# Patient Record
Sex: Male | Born: 1980
Health system: Southern US, Community
[De-identification: ages and names within clinical notes are randomized; demographics above are authoritative.]

## PROBLEM LIST (undated history)

## (undated) HISTORY — PX: INGUINAL HERNIA REPAIR: SUR1180

---

## 2004-09-13 ENCOUNTER — Emergency Department: Payer: Self-pay | Admitting: Emergency Medicine

## 2007-04-03 ENCOUNTER — Emergency Department: Payer: Self-pay | Admitting: Emergency Medicine

## 2007-04-27 ENCOUNTER — Emergency Department: Payer: Self-pay | Admitting: Emergency Medicine

## 2007-05-22 ENCOUNTER — Emergency Department: Payer: Self-pay | Admitting: Emergency Medicine

## 2007-06-26 ENCOUNTER — Other Ambulatory Visit: Payer: Self-pay

## 2007-06-26 ENCOUNTER — Emergency Department: Payer: Self-pay | Admitting: Emergency Medicine

## 2007-08-20 ENCOUNTER — Emergency Department: Payer: Self-pay | Admitting: Internal Medicine

## 2007-09-08 ENCOUNTER — Ambulatory Visit: Payer: Self-pay | Admitting: Urology

## 2008-11-23 ENCOUNTER — Emergency Department: Payer: Self-pay | Admitting: Internal Medicine

## 2009-06-30 ENCOUNTER — Emergency Department: Payer: Self-pay | Admitting: Emergency Medicine

## 2010-02-23 ENCOUNTER — Emergency Department: Payer: Self-pay | Admitting: Emergency Medicine

## 2010-02-27 ENCOUNTER — Emergency Department: Payer: Self-pay | Admitting: Unknown Physician Specialty

## 2010-06-03 ENCOUNTER — Emergency Department: Payer: Self-pay | Admitting: Unknown Physician Specialty

## 2013-02-08 ENCOUNTER — Emergency Department: Payer: Self-pay | Admitting: Emergency Medicine

## 2013-06-23 ENCOUNTER — Emergency Department: Payer: Self-pay | Admitting: Internal Medicine

## 2013-10-28 ENCOUNTER — Emergency Department: Payer: Self-pay | Admitting: Emergency Medicine

## 2013-10-29 LAB — BETA STREP CULTURE(ARMC)

## 2014-02-15 ENCOUNTER — Emergency Department: Payer: Self-pay | Admitting: Internal Medicine

## 2014-02-15 LAB — BASIC METABOLIC PANEL
Anion Gap: 7 (ref 7–16)
BUN: 11 mg/dL (ref 7–18)
Calcium, Total: 8.2 mg/dL — ABNORMAL LOW (ref 8.5–10.1)
Chloride: 108 mmol/L — ABNORMAL HIGH (ref 98–107)
Co2: 25 mmol/L (ref 21–32)
Creatinine: 0.75 mg/dL (ref 0.60–1.30)
Glucose: 106 mg/dL — ABNORMAL HIGH (ref 65–99)
Osmolality: 279 (ref 275–301)
POTASSIUM: 3.9 mmol/L (ref 3.5–5.1)
SODIUM: 140 mmol/L (ref 136–145)

## 2014-02-15 LAB — TROPONIN I: Troponin-I: <0.02 ng/mL

## 2014-02-15 LAB — CBC
HCT: 50.8 % (ref 40.0–52.0)
HGB: 17.2 g/dL (ref 13.0–18.0)
MCH: 28.5 pg (ref 26.0–34.0)
MCHC: 33.9 g/dL (ref 32.0–36.0)
MCV: 84 fL (ref 80–100)
Platelet: 231 10*3/uL (ref 150–440)
RBC: 6.03 10*6/uL — ABNORMAL HIGH (ref 4.40–5.90)
RDW: 13.9 % (ref 11.5–14.5)
WBC: 9.6 10*3/uL (ref 3.8–10.6)

## 2014-08-26 ENCOUNTER — Ambulatory Visit: Payer: Self-pay

## 2015-06-17 ENCOUNTER — Encounter (HOSPITAL_COMMUNITY): Payer: Self-pay | Admitting: Emergency Medicine

## 2015-06-17 ENCOUNTER — Inpatient Hospital Stay (HOSPITAL_COMMUNITY)
Admission: EM | Admit: 2015-06-17 | Discharge: 2015-06-22 | DRG: 885 | Disposition: A | Discharge status: Home / Self Care | Payer: Self-pay | Attending: Family Medicine | Admitting: Family Medicine

## 2015-06-17 ENCOUNTER — Emergency Department (HOSPITAL_COMMUNITY): Payer: Self-pay

## 2015-06-17 DIAGNOSIS — F4312 Post-traumatic stress disorder, chronic: Secondary | ICD-10-CM | POA: Diagnosis present

## 2015-06-17 DIAGNOSIS — H539 Unspecified visual disturbance: Secondary | ICD-10-CM

## 2015-06-17 DIAGNOSIS — F17210 Nicotine dependence, cigarettes, uncomplicated: Secondary | ICD-10-CM | POA: Diagnosis present

## 2015-06-17 DIAGNOSIS — F39 Unspecified mood [affective] disorder: Secondary | ICD-10-CM | POA: Diagnosis present

## 2015-06-17 DIAGNOSIS — R9089 Other abnormal findings on diagnostic imaging of central nervous system: Secondary | ICD-10-CM | POA: Insufficient documentation

## 2015-06-17 DIAGNOSIS — F22 Delusional disorders: Principal | ICD-10-CM | POA: Diagnosis present

## 2015-06-17 DIAGNOSIS — R410 Disorientation, unspecified: Secondary | ICD-10-CM

## 2015-06-17 DIAGNOSIS — F513 Sleepwalking [somnambulism]: Secondary | ICD-10-CM | POA: Diagnosis present

## 2015-06-17 DIAGNOSIS — R443 Hallucinations, unspecified: Secondary | ICD-10-CM | POA: Diagnosis present

## 2015-06-17 DIAGNOSIS — R441 Visual hallucinations: Secondary | ICD-10-CM | POA: Diagnosis present

## 2015-06-17 DIAGNOSIS — R479 Unspecified speech disturbances: Secondary | ICD-10-CM | POA: Insufficient documentation

## 2015-06-17 DIAGNOSIS — G9340 Encephalopathy, unspecified: Secondary | ICD-10-CM | POA: Diagnosis present

## 2015-06-17 DIAGNOSIS — R45851 Suicidal ideations: Secondary | ICD-10-CM

## 2015-06-17 DIAGNOSIS — R413 Other amnesia: Secondary | ICD-10-CM | POA: Diagnosis present

## 2015-06-17 DIAGNOSIS — R4781 Slurred speech: Secondary | ICD-10-CM | POA: Diagnosis present

## 2015-06-17 DIAGNOSIS — R4182 Altered mental status, unspecified: Secondary | ICD-10-CM

## 2015-06-17 DIAGNOSIS — R440 Auditory hallucinations: Secondary | ICD-10-CM | POA: Diagnosis present

## 2015-06-17 DIAGNOSIS — Q040 Congenital malformations of corpus callosum: Secondary | ICD-10-CM

## 2015-06-17 LAB — CBC WITH DIFFERENTIAL/PLATELET
Basophils Absolute: 0 10*3/uL (ref 0.0–0.1)
Basophils Relative: 0 %
EOS ABS: 0 10*3/uL (ref 0.0–0.7)
EOS PCT: 0 %
HCT: 50.8 % (ref 39.0–52.0)
HEMOGLOBIN: 17.9 g/dL — AB (ref 13.0–17.0)
LYMPHS ABS: 3 10*3/uL (ref 0.7–4.0)
Lymphocytes Relative: 32 %
MCH: 29.3 pg (ref 26.0–34.0)
MCHC: 35.2 g/dL (ref 30.0–36.0)
MCV: 83.3 fL (ref 78.0–100.0)
MONOS PCT: 7 %
Monocytes Absolute: 0.6 10*3/uL (ref 0.1–1.0)
NEUTROS PCT: 61 %
Neutro Abs: 5.9 10*3/uL (ref 1.7–7.7)
Platelets: 194 10*3/uL (ref 150–400)
RBC: 6.1 MIL/uL — ABNORMAL HIGH (ref 4.22–5.81)
RDW: 12.8 % (ref 11.5–15.5)
WBC: 9.6 10*3/uL (ref 4.0–10.5)

## 2015-06-17 LAB — BASIC METABOLIC PANEL
Anion gap: 11 (ref 5–15)
BUN: 9 mg/dL (ref 6–20)
CHLORIDE: 106 mmol/L (ref 101–111)
CO2: 21 mmol/L — AB (ref 22–32)
Calcium: 9.4 mg/dL (ref 8.9–10.3)
Creatinine, Ser: 0.91 mg/dL (ref 0.61–1.24)
GFR calc Af Amer: >60 mL/min (ref >60)
GFR calc non Af Amer: >60 mL/min (ref >60)
Glucose, Bld: 113 mg/dL — ABNORMAL HIGH (ref 65–99)
POTASSIUM: 4 mmol/L (ref 3.5–5.1)
Sodium: 138 mmol/L (ref 135–145)

## 2015-06-17 LAB — RAPID URINE DRUG SCREEN, HOSP PERFORMED
Amphetamines: NOT DETECTED
BARBITURATES: NOT DETECTED
BENZODIAZEPINES: NOT DETECTED
COCAINE: NOT DETECTED
OPIATES: NOT DETECTED
Tetrahydrocannabinol: NOT DETECTED

## 2015-06-17 LAB — CBG MONITORING, ED: Glucose-Capillary: 137 mg/dL — ABNORMAL HIGH (ref 65–99)

## 2015-06-17 LAB — TSH: TSH: 1.195 u[IU]/mL (ref 0.350–4.500)

## 2015-06-17 LAB — I-STAT CG4 LACTIC ACID, ED: Lactic Acid, Venous: 1.84 mmol/L (ref 0.5–2.0)

## 2015-06-17 LAB — ETHANOL

## 2015-06-17 MED ORDER — GADOBENATE DIMEGLUMINE 529 MG/ML IV SOLN
20.0000 mL | Freq: Once | INTRAVENOUS | Status: AC | PRN
  Administered 2015-06-17: 20 mL via INTRAVENOUS

## 2015-06-17 MED ORDER — SODIUM CHLORIDE 0.9 % IV BOLUS (SEPSIS)
500.0000 mL | Freq: Once | INTRAVENOUS | Status: AC
  Administered 2015-06-17: 500 mL via INTRAVENOUS

## 2015-06-17 MED ORDER — MIDAZOLAM HCL 2 MG/2ML IJ SOLN
1.0000 mg | Freq: Once | INTRAMUSCULAR | Status: AC
  Administered 2015-06-18: 1 mg via INTRAVENOUS
  Filled 2015-06-17: qty 2

## 2015-06-17 NOTE — ED Notes (Signed)
Pt returned from MRI °
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
## 2015-06-17 NOTE — ED Provider Notes (Signed)
CSN: [id number]     Arrival date & time 06/17/15  1741 History   First MD Initiated Contact with Patient 06/17/15 1811     Chief Complaint  Patient presents with  . Stuttering  . Hallucinations     (Consider location/radiation/quality/duration/timing/severity/associated sxs/prior Treatment) HPI Comments: 35 year old male with history of smoking, no other significant medical history presents with stuttering, slurred speech and hallucinations for 2-3 weeks. Patient is hearing voices tell him to go downstairs and he seeing human like people. These are transient. Patient has trouble getting words out at times. Patient denies history of similar before 3 weeks prior. Patient's father had a stroke history 60 years old. Patient moved from [place] during the war.  Pt lives with his mother.  Patient does not have a history of known psychiatric disorders or schizophrenia. Patient denies illegal drugs or current medications. No recent rashes or documented fever. No tick bites. No recent travel.  The history is provided by the patient.    History reviewed. No pertinent past medical history. Past Surgical History  Procedure Laterality Date  . Inguinal hernia repair     History reviewed. No pertinent family history. Social History  Substance Use Topics  . Smoking status: Current Every Day Smoker -- 0.50 packs/day    Types: Cigarettes  . Smokeless tobacco: None  . Alcohol Use: No    Review of Systems  Constitutional: Positive for appetite change. Negative for fever and chills.  HENT: Negative for congestion.   Eyes: Negative for visual disturbance.  Respiratory: Negative for shortness of breath.   Cardiovascular: Negative for chest pain.  Gastrointestinal: Positive for nausea. Negative for vomiting and abdominal pain.  Genitourinary: Negative for dysuria and flank pain.  Musculoskeletal: Negative for back pain, neck pain and neck stiffness.  Skin: Negative for rash.  Neurological: Positive for  dizziness, speech difficulty and headaches (intermittent mild). Negative for syncope and light-headedness.      Allergies  Review of patient's allergies indicates no known allergies.  Home Medications   Prior to Admission medications   Not on File   BP 119/87 mmHg  Pulse 80  Temp(Src) 100.1 F (37.8 C) (Rectal)  Resp 17  SpO2 99% Physical Exam  Constitutional: He is oriented to person, place, and time. He appears well-developed and well-nourished.  HENT:  Head: Normocephalic and atraumatic.  Eyes: Right eye exhibits no discharge. Left eye exhibits no discharge.  Neck: Normal range of motion. Neck supple. No tracheal deviation present.  Cardiovascular: Normal rate and regular rhythm.   Pulmonary/Chest: Effort normal and breath sounds normal.  Abdominal: Soft. He exhibits no distension. There is no tenderness. There is no guarding.  Musculoskeletal: He exhibits no edema.  Neurological: He is alert and oriented to person, place, and time. GCS eye subscore is 4. GCS verbal subscore is 5. GCS motor subscore is 6.  Patient has intermittent mild slurring of speech. Patient has intermittent stuttering that improves with time or distraction. Patient has equal strength upper lower extremities with general mild weakness. Pupils equal bilateral extraocular muscle function intact. Sensation intact palpation bilateral upper lower extremity's.  Skin: Skin is warm. No rash noted.  Psychiatric: His mood appears anxious.  Nursing note and vitals reviewed.   ED Course  Procedures (including critical care time) Labs Review Labs Reviewed  BASIC METABOLIC PANEL - Abnormal; Notable for the following:    CO2 21 (*)    Glucose, Bld 113 (*)    All other components within normal limits  CBC  WITH DIFFERENTIAL/PLATELET - Abnormal; Notable for the following:    RBC 6.10 (*)    Hemoglobin 17.9 (*)    All other components within normal limits  CBG MONITORING, ED - Abnormal; Notable for the following:     Glucose-Capillary 137 (*)    All other components within normal limits  CSF CULTURE  URINE RAPID DRUG SCREEN, HOSP PERFORMED  ETHANOL  TSH  CSF CELL COUNT WITH DIFFERENTIAL  CSF CELL COUNT WITH DIFFERENTIAL  HERPES SIMPLEX VIRUS(HSV) DNA BY PCR  GLUCOSE, CSF  PROTEIN, CSF  VDRL, CSF  I-STAT CG4 LACTIC ACID, ED    Imaging Review Mr Lodema PilotBrain W Wo Contrast  06/17/2015  CLINICAL DATA:  Speech disturbance. Slurred speech. Hallucinations. Symptoms of 3 weeks duration. EXAM: MRI HEAD WITHOUT AND WITH CONTRAST TECHNIQUE: Multiplanar, multiecho pulse sequences of the brain and surrounding structures were obtained without and with intravenous contrast. CONTRAST:  20mL MULTIHANCE GADOBENATE DIMEGLUMINE 529 MG/ML IV SOLN COMPARISON:  None. FINDINGS: The brain is congenitally abnormal. There is extreme hypoplasia of the corpus callosum, bordering on a aplasia. The splenium of the corpus callosum is present. The anterior commissure is present. There are a few other crossing fibers. There is an interhemispheric arachnoid cyst with multiple loculations. This extends more towards the right. The occipital horns of both lateral ventricles are prominent. This is not likely on the basis of obstructive hydrocephalus, but rather developmental. No evidence of any heterotopic brain. The falx is well developed. There is no acute or acquired pathology. No old or acute infarction, neoplastic mass lesion, hemorrhage or subdural collection. No pituitary mass. No inflammatory sinus disease. Retention cyst in the left maxillary sinus. 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 Interhemispheric arachnoid cyst anteriorly. No evidence of acquired brain pathology such as infarction or obstructive hydrocephalus. Electronically Signed   By: Paulina FusiMark  Shogry M.D.   On: 06/17/2015 21:20   I have personally reviewed and evaluated these images and lab results as part of my medical decision-making.   EKG  Interpretation None      MDM   Final diagnoses:  Speech abnormality  Suicidal ideation   Patient presents with worsening stuttering, speech difficulty and hallucinations. Patient denies illegal drugs. Pt has a nonfocal neurologic exam mother with patient in the room. Patient does admit to worsening thoughts of stress related to when he lived in [place].  No herpes rash.  Plan for screening blood work, head imaging, neuro consult and likely psychiatric consult. No fevers or meningismus on exam.  Patient does admit that the voices have led to him wanting to hurt himself. Neurology assessed the patient, MRI no acute findings. Plan for admission to medicine, EEG for temporal lobe seizures and TTS consult.  With low grade temp neurology recommended LP for infection.  Patient with clinically no encephalitis clinically no meningitis. Patient initially did not want her puncture but did agree to one attempt. Unsuccessful attempt. [facility] neurology up-to-date. Patient will require radiology guided lumbar puncture or 10 by neurology. [facility] to admit and follow results.   The patients results and plan were reviewed and discussed.   Any x-rays performed were independently reviewed by myself.   Differential diagnosis were considered with the presenting HPI.  Medications  sodium chloride 0.9 % bolus 500 mL (0 mLs Intravenous Stopped 06/17/15 2000)  gadobenate dimeglumine (MULTIHANCE) injection 20 mL (20 mLs Intravenous Contrast Given 06/17/15 2053)  midazolam (VERSED) injection 1 mg (1 mg Intravenous Given 06/18/15 0002)    Filed Vitals:  06/17/15 2140 06/17/15 2200 06/17/15 2230 06/18/15 0030  BP:  122/60 124/65 119/87  Pulse: 71 69 67 80  Temp:      TempSrc:      Resp:  SpO2: 98% 96% 97% 99%    Final diagnoses:  Speech abnormality  Suicidal ideation    Admission/ observation were discussed with the admitting physician, patient and/or family and they are comfortable with the plan.     Blane Ohara, MD 06/18/15 [phone]

## 2015-06-17 NOTE — ED Notes (Signed)
Dr. Stewart at the bedside. 

## 2015-06-17 NOTE — ED Notes (Signed)
TTS at bedside. 

## 2015-06-17 NOTE — ED Notes (Signed)
Pt here with intermittent stuttering and slurred speech x 3 weeks. Pt sts it comes and goes and he thought it would go away. Pt also reports hearing voices at night "calling me downstairs." Pt having trouble getting words out at triage and having some muscle twitching as well.

## 2015-06-18 DIAGNOSIS — R930 Abnormal findings on diagnostic imaging of skull and head, not elsewhere classified: Secondary | ICD-10-CM

## 2015-06-18 DIAGNOSIS — R443 Hallucinations, unspecified: Secondary | ICD-10-CM | POA: Diagnosis present

## 2015-06-18 DIAGNOSIS — R9089 Other abnormal findings on diagnostic imaging of central nervous system: Secondary | ICD-10-CM | POA: Insufficient documentation

## 2015-06-18 DIAGNOSIS — R479 Unspecified speech disturbances: Secondary | ICD-10-CM | POA: Insufficient documentation

## 2015-06-18 DIAGNOSIS — Q040 Congenital malformations of corpus callosum: Secondary | ICD-10-CM | POA: Insufficient documentation

## 2015-06-18 LAB — AST: AST: 25 U/L (ref 15–41)

## 2015-06-18 LAB — ALT: ALT: 34 U/L (ref 17–63)

## 2015-06-18 MED ORDER — ACETAMINOPHEN 325 MG PO TABS
650.0000 mg | ORAL_TABLET | Freq: Four times a day (QID) | ORAL | Status: DC | PRN
  Administered 2015-06-20 (×2): 650 mg via ORAL
  Filled 2015-06-18 (×2): qty 2

## 2015-06-18 MED ORDER — DEXTROSE 5 % IV SOLN
700.0000 mg | Freq: Three times a day (TID) | INTRAVENOUS | Status: DC
  Administered 2015-06-18 – 2015-06-22 (×13): 700 mg via INTRAVENOUS
  Filled 2015-06-18 (×16): qty 14

## 2015-06-18 MED ORDER — ONDANSETRON HCL 4 MG/2ML IJ SOLN: 4.0000 mg | Freq: Four times a day (QID) | INTRAMUSCULAR | Status: DC | PRN

## 2015-06-18 MED ORDER — ENOXAPARIN SODIUM 40 MG/0.4ML ~~LOC~~ SOLN
40.0000 mg | SUBCUTANEOUS | Status: DC
  Administered 2015-06-18 – 2015-06-21 (×3): 40 mg via SUBCUTANEOUS
  Filled 2015-06-18 (×3): qty 0.4

## 2015-06-18 MED ORDER — NICOTINE 7 MG/24HR TD PT24
7.0000 mg | MEDICATED_PATCH | Freq: Every day | TRANSDERMAL | Status: DC
  Administered 2015-06-18: 7 mg via TRANSDERMAL
  Filled 2015-06-18: qty 1

## 2015-06-18 NOTE — ED Notes (Addendum)
Admitting at bedside 

## 2015-06-18 NOTE — Progress Notes (Signed)
Pharmacy Antibiotic Note  Gareld Hartley BarefootDauti is a 35 y.o. male admitted on 06/17/2015 with new onset hallucinations concerning for encephalitis.  Pharmacy has been consulted for acyclovir dosing.  Plan: Acyclovir 700 mg IV q8h  Monitor C&S, CBC and clinical progression  Height: 5\' 9"  (175.3 cm) Weight: 216 lb 0.8 oz (98 kg) IBW/kg (Calculated) : 70.7  Temp (24hrs), Avg:99.4 F (37.4 C), Min:98.3 F (36.8 C), Max:100.1 F (37.8 C)   Recent Labs Lab 06/17/15 1855 06/17/15 1901  WBC 9.6  --   CREATININE 0.91  --   LATICACIDVEN  --  1.84    Estimated Creatinine Clearance: 133.3 mL/min (by C-G formula based on Cr of 0.91).    No Known Allergies  Antimicrobials this admission: Acyclovir 5/14 >>   Microbiology results: Pending LP collection, unsuccessful in ED  Thank you for allowing pharmacy to be a part of this patient's care.  Casilda Carlsaylor Keonta Alsip, PharmD. PGY-1 Pharmacy Resident Pager: [phone] 06/18/2015 7:50 AM

## 2015-06-18 NOTE — ED Provider Notes (Signed)
Lumbar Puncture Procedure Note  Pre-operative Diagnosis: Altered mental status  Post-operative Diagnosis: Altered mental status  Indications: Diagnostic  Procedure Details   Consent: Informed consent was obtained. Risks of the procedure were discussed including: infection, bleeding, pain and headache.  The patient was positioned under sterile conditions. Betadine solution and sterile drapes were utilized. A spinal needle was inserted at the L3 - L4 interspace.  Spinal fluid was obtained and sent to the laboratory.  Findings NONE. Unable to obtain sample.  Complications:  None; patient tolerated the procedure well.        Condition: stable  Plan Bed rest for 1 hours.  Repeat LP as inpatient with IR.    Lindalou HoseSean O'Rourke, MD 06/18/15 [id number]  Blane OharaJoshua Zavitz, MD 06/18/15 [phone]

## 2015-06-18 NOTE — Progress Notes (Signed)
Interval History:                                                                                                                      Jared Newton is an 35 y.o. male patient with  intermittent visual and auditory hallucinations. 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 No acute pathology noted.  No new neurological symptoms.  Lumbar puncture through IR still pending. EEG pending.      Past Medical History: History reviewed. No pertinent past medical history.  Past Surgical History  Procedure Laterality Date  . Inguinal hernia repair      Family History: History reviewed. No pertinent family history.  Social History:   reports that he has been smoking Cigarettes.  He has been smoking about 0.50 packs per day. He does not have any smokeless tobacco history on file. He reports that he does not drink alcohol or use illicit drugs.  Allergies:  No Known Allergies   Medications:                                                                                                                         Current facility-administered medications:  .  acetaminophen (TYLENOL) tablet 650 mg, 650 mg, Oral, Q6H PRN, Vassie Lollarlos Madera, MD .  acyclovir (ZOVIRAX) 700 mg in dextrose 5 % 100 mL IVPB, 700 mg, Intravenous, Q8H, Taylor P Stone, RPH, 700 mg at 06/18/15 1402 .  enoxaparin (LOVENOX) injection 40 mg, 40 mg, Subcutaneous, Q24H, Eston EstersAhmad Hamad, MD .  ondansetron [facility](ZOFRAN) injection 4 mg, 4 mg, Intravenous, Q6H PRN, Vassie Lollarlos Madera, MD   Neurologic Examination:                                                                                                     Today's Vitals   06/17/15 2230 06/18/15 0030 06/18/15 0052 06/18/15 0400  BP: 124/65 119/87  117/61  Pulse: 67 80    Temp:    98.3 F (36.8 C)  TempSrc:  Oral  Resp: Height:     (1.753 m)  Weight:    98 kg (216 lb 0.8 oz)   SpO2: 97% 99%  97%  PainSc:   0-No pain     Evaluation of higher integrative functions including: Level of alertness: Alert,  Oriented to time, place and person Speech: fluent, no evidence of dysarthria or aphasia noted.  Test the following cranial nerves: 2-12 grossly intact Motor examination:  full 5/5 motor strength in all 4 extremities Test coordination:   no evidence of limb appendicular ataxia or abnormal involuntary movements or tremors noted.    Lab Results: Basic Metabolic Panel:  Recent Labs Lab 06/17/15 1855  NA 138  K 4.0  CL 106  CO2 21*  GLUCOSE 113*  BUN 9  CREATININE 0.91  CALCIUM 9.4    Liver Function Tests:  Recent Labs Lab 06/18/15 0832  AST 25  ALT 34   No results for input(s): LIPASE, AMYLASE in the last 168 hours. No results for input(s): AMMONIA in the last 168 hours.  CBC:  Recent Labs Lab 06/17/15 1855  WBC 9.6  NEUTROABS 5.9  HGB 17.9*  HCT 50.8  MCV 83.3  PLT 194    Cardiac Enzymes: No results for input(s): CKTOTAL, CKMB, CKMBINDEX, TROPONINI in the last 168 hours.  Lipid Panel: No results for input(s): CHOL, TRIG, HDL, CHOLHDL, VLDL, LDLCALC in the last 168 hours.  CBG:  Recent Labs Lab 06/17/15 1750  GLUCAP 137*    Microbiology: Results for orders placed or performed in visit on 10/28/13  Beta Strep Culture [facility])     Status: None   Collection Time: 10/28/13  7:53 PM  Result Value Ref Range Status   Micro Text Report   Final       SOURCE: THROAT    ORGANISM 1                MODERATE GROWTH GROUP F STREPTOCOCCUS   COMMENT                   -   ANTIBIOTIC                                                        Imaging: Mr Lodema Pilot Contrast  06/17/2015  CLINICAL DATA:  Speech disturbance. Slurred speech. Hallucinations. Symptoms of 3 weeks duration. EXAM: MRI HEAD WITHOUT AND WITH CONTRAST TECHNIQUE: Multiplanar, multiecho pulse sequences of the brain and surrounding structures were obtained without and  with intravenous contrast. CONTRAST:  20mL MULTIHANCE GADOBENATE DIMEGLUMINE 529 MG/ML IV SOLN COMPARISON:  None. FINDINGS: The brain is congenitally abnormal. There is extreme hypoplasia of the corpus callosum, bordering on a aplasia. The splenium of the corpus callosum is present. The anterior commissure is present. There are a few other crossing fibers. There is an interhemispheric arachnoid cyst with multiple loculations. This extends more towards the right. The occipital horns of both lateral ventricles are prominent. This is not likely on the basis of obstructive hydrocephalus, but rather developmental. No evidence of any heterotopic brain. The falx is well developed. There is no acute or acquired pathology. No old or acute infarction, neoplastic mass lesion, hemorrhage or subdural collection. No pituitary mass. No inflammatory sinus disease. Retention cyst in the left maxillary sinus. 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 Interhemispheric arachnoid cyst anteriorly. No evidence of acquired brain pathology such as infarction or obstructive hydrocephalus. Electronically Signed   By: Paulina Fusi M.D.   On: 06/17/2015 21:20    Assessment and plan:   Jared Newton is an 35 y.o. male patient withIntermittent episodes of auditory and visual hallucinations, with abnormal brain MRI showing severe hypogenesis of corpus callosum, with interhemispheric arachnoid cyst, likely congenital abnormalities.  Based on these abnormality is, there is a theoretical possibility of his intermittent hallucination episodes being epileptiform in nature.  Awaiting EEG and lumbar puncture through IR, to be done tomorrow.  No new neurological symptoms. Reviewed MRI images with the patient.   We'll follow up
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
## 2015-06-18 NOTE — Progress Notes (Signed)
Patient seen and examined. Admitted after midnight secondary to low grade fever and acute onset of hallucinations and stuttering. Patient MRI of the brain w/o acute abnormalities and no prior hx of psychiatry conditions. Patient endorses hedachaes and also some slight photophobia and general malaise. Please refer to H&P written by Dr. Mickle MalloryHamad for further info/details on admission.  Plan: -neurology consulted and recommended IR for LP and EEG -started empirically on IV acyclovir, dose per pharmacy  -will also use PRN tylenol for pain and fever  -follow clinical Andria FramesReponse    Tae Vonada [phone]

## 2015-06-19 ENCOUNTER — Inpatient Hospital Stay (HOSPITAL_COMMUNITY): Payer: MEDICAID

## 2015-06-19 DIAGNOSIS — R443 Hallucinations, unspecified: Secondary | ICD-10-CM

## 2015-06-19 LAB — HIV ANTIBODY (ROUTINE TESTING W REFLEX): HIV SCREEN 4TH GENERATION: NONREACTIVE

## 2015-06-19 MED ORDER — NICOTINE 21 MG/24HR TD PT24: 21.0000 mg | MEDICATED_PATCH | Freq: Every day | TRANSDERMAL | Status: DC

## 2015-06-19 MED ORDER — NICOTINE 21 MG/24HR TD PT24
21.0000 mg | MEDICATED_PATCH | Freq: Every day | TRANSDERMAL | Status: DC
  Administered 2015-06-19 – 2015-06-22 (×4): 21 mg via TRANSDERMAL
  Filled 2015-06-19 (×4): qty 1

## 2015-06-19 NOTE — Progress Notes (Signed)
Interval History:                                                                                                                      Jared Newton is an 35 y.o. male patient with  intermittent visual and auditory hallucinations. 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 No acute pathology noted.  No new neurological symptoms.  Lumbar puncture through IR still pending.   EEG normal.        Past Medical History: History reviewed. No pertinent past medical history.  Past Surgical History  Procedure Laterality Date  . Inguinal hernia repair      Family History: History reviewed. No pertinent family history.  Social History:   reports that he has been smoking Cigarettes.  He has been smoking about 0.50 packs per day. He does not have any smokeless tobacco history on file. He reports that he does not drink alcohol or use illicit drugs.  Allergies:  No Known Allergies   Medications:                                                                                                                         Current facility-administered medications:  .  acetaminophen (TYLENOL) tablet 650 mg, 650 mg, Oral, Q6H PRN, Vassie Loll, MD .  acyclovir (ZOVIRAX) 700 mg in dextrose 5 % 100 mL IVPB, 700 mg, Intravenous, Q8H, Belinda Fisher Stone, RPH, 700 mg at 06/19/15 1528 .  enoxaparin (LOVENOX) injection 40 mg, 40 mg, Subcutaneous, Q24H, Eston Esters, MD, Stopped at 06/19/15 2000 .  nicotine (NICODERM CQ - dosed in mg/24 hours) patch 21 mg, 21 mg, Transdermal, Daily, Rhetta Mura, MD, 21 mg at 06/19/15 1810 .  ondansetron (ZOFRAN) injection 4 mg, 4 mg, Intravenous, Q6H PRN, Vassie Loll, MD   Neurologic Examination:                                                                                                     Today's Vitals  06/18/15 2208 06/19/15 0558 06/19/15 1339 06/19/15 2000  BP:  128/76 114/70 113/42   Pulse: 65 57 67   Temp: 99 F (37.2 C) 97.7 F (36.5 C) 98.1 F (36.7 C)   TempSrc: Oral  Oral   Resp: Height:      Weight:      SpO2: 93% 98% 94%   PainSc:    0-No pain    Evaluation of higher integrative functions including: Level of alertness: Alert,  Oriented to time, place and person Speech: fluent, no evidence of dysarthria or aphasia noted.  Test the following cranial nerves: 2-12 grossly intact Motor examination:  full 5/5 motor strength in all 4 extremities Test coordination:   no evidence of limb appendicular ataxia or abnormal involuntary movements or tremors noted.    Lab Results: Basic Metabolic Panel:  Recent Labs Lab 06/17/15 1855  NA 138  K 4.0  CL 106  CO2 21*  GLUCOSE 113*  BUN 9  CREATININE 0.91  CALCIUM 9.4    Liver Function Tests:  Recent Labs Lab 06/18/15 0832  AST 25  ALT 34   No results for input(s): LIPASE, AMYLASE in the last 168 hours. No results for input(s): AMMONIA in the last 168 hours.  CBC:  Recent Labs Lab 06/17/15 1855  WBC 9.6  NEUTROABS 5.9  HGB 17.9*  HCT 50.8  MCV 83.3  PLT 194    Cardiac Enzymes: No results for input(s): CKTOTAL, CKMB, CKMBINDEX, TROPONINI in the last 168 hours.  Lipid Panel: No results for input(s): CHOL, TRIG, HDL, CHOLHDL, VLDL, LDLCALC in the last 168 hours.  CBG:  Recent Labs Lab 06/17/15 1750  GLUCAP 137*    Microbiology: Results for orders placed or performed in visit on 10/28/13  Beta Strep Culture [facility])     Status: None   Collection Time: 10/28/13  7:53 PM  Result Value Ref Range Status   Micro Text Report   Final       SOURCE: THROAT    ORGANISM 1                MODERATE GROWTH GROUP F STREPTOCOCCUS   COMMENT                   -   ANTIBIOTIC                                                        Imaging: Mr Lodema Pilot Contrast  06/17/2015  CLINICAL DATA:  Speech disturbance. Slurred speech. Hallucinations. Symptoms of 3  weeks duration. EXAM: MRI HEAD WITHOUT AND WITH CONTRAST TECHNIQUE: Multiplanar, multiecho pulse sequences of the brain and surrounding structures were obtained without and with intravenous contrast. CONTRAST:  20mL MULTIHANCE GADOBENATE DIMEGLUMINE 529 MG/ML IV SOLN COMPARISON:  None. FINDINGS: The brain is congenitally abnormal. There is extreme hypoplasia of the corpus callosum, bordering on a aplasia. The splenium of the corpus callosum is present. The anterior commissure is present. There are a few other crossing fibers. There is an interhemispheric arachnoid cyst with multiple loculations. This extends more towards the right. The occipital horns of both lateral ventricles are prominent. This is not likely on the basis of obstructive hydrocephalus, but rather developmental. No evidence of any heterotopic brain. The falx is well developed. There is  no acute or acquired pathology. No old or acute infarction, neoplastic mass lesion, hemorrhage or subdural collection. No pituitary mass. No inflammatory sinus disease. Retention cyst in the left maxillary sinus. 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 Interhemispheric arachnoid cyst anteriorly. No evidence of acquired brain pathology such as infarction or obstructive hydrocephalus. Electronically Signed   By: Paulina FusiMark  Shogry M.D.   On: 06/17/2015 21:20    Assessment and plan:   Jared Newton is an 35 y.o. male patient withIntermittent episodes of auditory and visual hallucinations, with abnormal brain MRI showing severe hypogenesis of corpus callosum, with interhemispheric arachnoid cyst, likely congenital abnormalities.  Based on these abnormality is, there is a theoretical possibility of his intermittent hallucination episodes being epileptiform in nature.  EEG normal.  Lumbar puncture through IR, to be done tomorrow.   No new neurological symptoms.  We'll follow up
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
## 2015-06-19 NOTE — Procedures (Signed)
HPI:  35y/o with auditory and visual hallucinations  TECHNICAL SUMMARY:  A multichannel referential and bipolar montage EEG using the standard international 10-20 system was performed on the patient described as awake, drowsy and asleep.  The dominant background activity consists of 9-10 hertz activity seen most prominantly over the posterior head region.  The backgound activity is reactive to eye opening and closing procedures.  Low voltage fast (beta) activity is distributed symmetrically and maximally over the anterior head regions.  ACTIVATION:  Stepwise photic stimulation at 4-20 flashes per second was performed and did not elicit any abnormal waveforms but did produce a symmetric driving response.  Hyperventilation was performed for 3 minutes with good patient effort and demonstrated a slowing and attenuation of the background activity, consistent with a normal hyperventilation response.  EPILEPTIFORM ACTIVITY:  There were no spikes, sharp waves or paroxysmal activity.  SLEEP:  Physiologic drowsiness is noted, but no stage II sleep.  CARDIAC:  The EKG lead revealed a regular sinus rhythm.  IMPRESSION:  This is a normal EEG for the patients stated age.  There were no focal, hemispheric or lateralizing features.  No epileptiform activity was recorded.  A normal EEG does not exclude the diagnosis of a seizure disorder and if seizure remains high on the list of differential diagnosis, an ambulatory EEG may be of value.  Clinical correlation is required.

## 2015-06-19 NOTE — Care Management Note (Signed)
Case Management Note  Patient Details  Name: Geralynn OchsBesfort Woulfe MRN: [id number] Date of Birth: 1980-02-14  Subjective/Objective:                 Spoke to patient at the bedside. Patient in burgundy scrubs with Recruitment consultantsafety sitter. [place] from [place], speaks English well. Patient does not have PCP or insurance at this time. Patient admitted for hallucinations, unclear etiology being treated emperically for meningitis, had LP in IR after failed attempt in ED, also with low grade temp, photophobia, HA. Per neuro note "patient with intermittent visual and auditory hallucinations. 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 No acute pathology noted.' Patient [facility], although there is not clear documentation to support this at this time.    Action/Plan:  If DC to home [facility]. CM will continue to follow.  Expected Discharge Date:                  Expected Discharge Plan:   (unsure at this time)  In-House Referral:     Discharge planning Services     Post Acute Care Choice:    Choice offered to:     DME Arranged:    DME Agency:     HH Arranged:    HH Agency:     Status of Service:  In process, will continue to follow  Medicare Important Message Given:    Date Medicare IM Given:    Medicare IM give by:    Date Additional Medicare IM Given:    Additional Medicare Important Message give by:     If discussed at Long Length of Stay Meetings, dates discussed:    Additional Comments:  Lawerance SabalDebbie Avyanna Spada, RN 06/19/2015, 1:50 PM

## 2015-06-19 NOTE — Progress Notes (Signed)
EEG Completed; Results Pending  

## 2015-06-20 ENCOUNTER — Inpatient Hospital Stay (HOSPITAL_COMMUNITY): Payer: Self-pay

## 2015-06-20 DIAGNOSIS — F39 Unspecified mood [affective] disorder: Secondary | ICD-10-CM | POA: Diagnosis present

## 2015-06-20 LAB — CSF CELL COUNT WITH DIFFERENTIAL
RBC Count, CSF: 18 /mm3 — ABNORMAL HIGH
Tube #: 3
WBC CSF: 1 /mm3 (ref 0–5)

## 2015-06-20 LAB — PROTEIN, CSF: Total  Protein, CSF: 60 mg/dL — ABNORMAL HIGH (ref 15–45)

## 2015-06-20 LAB — GLUCOSE, CSF: GLUCOSE CSF: 60 mg/dL (ref 40–70)

## 2015-06-20 MED ORDER — LIDOCAINE HCL (PF) 1 % IJ SOLN
INTRAMUSCULAR | Status: AC
  Administered 2015-06-20: 5 mL via INTRAMUSCULAR
  Filled 2015-06-20: qty 5

## 2015-06-20 MED ORDER — PAROXETINE HCL ER 12.5 MG PO TB24
12.5000 mg | ORAL_TABLET | Freq: Every day | ORAL | Status: DC
  Administered 2015-06-20 – 2015-06-21 (×2): 12.5 mg via ORAL
  Filled 2015-06-20 (×2): qty 1

## 2015-06-20 MED ORDER — RISPERIDONE 0.5 MG PO TABS
0.5000 mg | ORAL_TABLET | Freq: Every day | ORAL | Status: DC
  Administered 2015-06-20 – 2015-06-21 (×2): 0.5 mg via ORAL
  Filled 2015-06-20 (×2): qty 1

## 2015-06-20 NOTE — Progress Notes (Signed)
Interval History:                                                                                                                      Domnic Masterson is an 35 y.o. male patient with  intermittent visual and auditory hallucinations. 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 No acute pathology noted.  No new neurological symptoms.  Lumbar puncture through IR obtained this AM.   EEG normal.     Past Medical History: History reviewed. No pertinent past medical history.  Past Surgical History  Procedure Laterality Date  . Inguinal hernia repair      Family History: History reviewed. No pertinent family history.  Social History:   reports that he has been smoking Cigarettes.  He has been smoking about 0.50 packs per day. He does not have any smokeless tobacco history on file. He reports that he does not drink alcohol or use illicit drugs.  Allergies:  No Known Allergies   Medications:                                                                                                                         Current facility-administered medications:  .  acetaminophen (TYLENOL) tablet 650 mg, 650 mg, Oral, Q6H PRN, Vassie Loll, MD, 650 mg at 06/20/15 0952 .  acyclovir (ZOVIRAX) 700 mg in dextrose 5 % 100 mL IVPB, 700 mg, Intravenous, Q8H, Belinda Fisher Stone, RPH, 700 mg at 06/20/15 0507 .  enoxaparin (LOVENOX) injection 40 mg, 40 mg, Subcutaneous, Q24H, Eston Esters, MD, Stopped at 06/19/15 2000 .  nicotine (NICODERM CQ - dosed in mg/24 hours) patch 21 mg, 21 mg, Transdermal, Daily, Rhetta Mura, MD, 21 mg at 06/20/15 0944 .  ondansetron (ZOFRAN) injection 4 mg, 4 mg, Intravenous, Q6H PRN, Vassie Loll, MD   Neurologic Examination:  Today's Vitals   06/19/15 2127 06/20/15 0545 06/20/15  0730 06/20/15 0900  BP: 129/71 117/65    Pulse: 79 71    Temp: 98.6 F (37 C) 97.9 F (36.6 C)    TempSrc: Oral Oral    Resp: 16     Height:      Weight:      SpO2: 96% 95%    PainSc:   Asleep 10-Worst pain ever    Evaluation of higher integrative functions including: Level of alertness: Alert,  Oriented to time, place and person Speech: fluent, no evidence of dysarthria or aphasia noted.  Test the following cranial nerves: 2-12 grossly intact Motor examination: Normal tone, bulk, full 5/5 motor strength in all 4 extremities Examination of sensation : Normal and symmetric sensation to pinprick in all 4 extremities and on face Examination of deep tendon reflexes: 2+, normal and symmetric in all extremities, normal plantars bilaterally Test coordination: Normal finger nose testing, with no evidence of limb appendicular ataxia or abnormal involuntary movements or tremors noted.  Gait: Deferred   Lab Results: Basic Metabolic Panel:  Recent Labs Lab 06/17/15 1855  NA 138  K 4.0  CL 106  CO2 21*  GLUCOSE 113*  BUN 9  CREATININE 0.91  CALCIUM 9.4    Liver Function Tests:  Recent Labs Lab 06/18/15 0832  AST 25  ALT 34   No results for input(s): LIPASE, AMYLASE in the last 168 hours. No results for input(s): AMMONIA in the last 168 hours.  CBC:  Recent Labs Lab 06/17/15 1855  WBC 9.6  NEUTROABS 5.9  HGB 17.9*  HCT 50.8  MCV 83.3  PLT 194    Cardiac Enzymes: No results for input(s): CKTOTAL, CKMB, CKMBINDEX, TROPONINI in the last 168 hours.  Lipid Panel: No results for input(s): CHOL, TRIG, HDL, CHOLHDL, VLDL, LDLCALC in the last 168 hours.  CBG:  Recent Labs Lab 06/17/15 1750  GLUCAP 137*    Microbiology: Results for orders placed or performed in visit on 10/28/13  Beta Strep Culture [facility])     Status: None   Collection Time: 10/28/13  7:53 PM  Result Value Ref Range Status   Micro Text Report   Final       SOURCE: THROAT    ORGANISM 1                 MODERATE GROWTH GROUP F STREPTOCOCCUS   COMMENT                   -   ANTIBIOTIC                                                        Imaging: Mr Lodema Pilot Contrast  06/17/2015  CLINICAL DATA:  Speech disturbance. Slurred speech. Hallucinations. Symptoms of 3 weeks duration. EXAM: MRI HEAD WITHOUT AND WITH CONTRAST TECHNIQUE: Multiplanar, multiecho pulse sequences of the brain and surrounding structures were obtained without and with intravenous contrast. CONTRAST:  20mL MULTIHANCE GADOBENATE DIMEGLUMINE 529 MG/ML IV SOLN COMPARISON:  None. FINDINGS: The brain is congenitally abnormal. There is extreme hypoplasia of the corpus callosum, bordering on a aplasia. The splenium of the corpus callosum is present. The anterior commissure is present. There are a few other crossing fibers. There is an interhemispheric arachnoid cyst with multiple loculations.  This extends more towards the right. The occipital horns of both lateral ventricles are prominent. This is not likely on the basis of obstructive hydrocephalus, but rather developmental. No evidence of any heterotopic brain. The falx is well developed. There is no acute or acquired pathology. No old or acute infarction, neoplastic mass lesion, hemorrhage or subdural collection. No pituitary mass. No inflammatory sinus disease. Retention cyst in the left maxillary sinus. 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 Interhemispheric arachnoid cyst anteriorly. No evidence of acquired brain pathology such as infarction or obstructive hydrocephalus. Electronically Signed   By: Paulina FusiMark  Shogry M.D.   On: 06/17/2015 21:20   Dg Fluoro Guide Lumbar Puncture  06/20/2015  Florencia Reasonsaniel W Entrikin, MD     06/20/2015  9:57 AM Lumbar puncture performed at L4-L5.  Opening pressure 16 cm H2O.  10.5 mL clear CSF obtained and sent to lab.  See dictation in PACS for full details.    Assessment and plan:   Feliz Jutras is an 35 y.o. male patient  with withIntermittent episodes of auditory and visual hallucinations, with abnormal brain MRI showing severe hypogenesis of corpus callosum, with interhemispheric arachnoid cyst, likely congenital abnormalities.  Based on these abnormality is, there is a theoretical possibility of his intermittent hallucination episodes being epileptiform in nature.  EEG normal.  Lumbar puncture through IR,done with labs pending   No new neurological symptoms.   We'll follow up
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
## 2015-06-20 NOTE — Procedures (Signed)
Lumbar puncture performed at L4-L5.  Opening pressure 16 cm H2O.  10.5 mL clear CSF obtained and sent to lab.  See dictation in PACS for full details.
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
## 2015-06-21 ENCOUNTER — Inpatient Hospital Stay (HOSPITAL_COMMUNITY): Payer: MEDICAID

## 2015-06-21 DIAGNOSIS — Q040 Congenital malformations of corpus callosum: Secondary | ICD-10-CM

## 2015-06-21 LAB — VDRL, CSF: VDRL Quant, CSF: NONREACTIVE

## 2015-06-21 LAB — BASIC METABOLIC PANEL
Anion gap: 10 (ref 5–15)
BUN: 12 mg/dL (ref 6–20)
CALCIUM: 9.3 mg/dL (ref 8.9–10.3)
CO2: 26 mmol/L (ref 22–32)
CREATININE: 0.82 mg/dL (ref 0.61–1.24)
Chloride: 105 mmol/L (ref 101–111)
GFR calc Af Amer: >60 mL/min (ref >60)
GLUCOSE: 100 mg/dL — AB (ref 65–99)
Potassium: 3.9 mmol/L (ref 3.5–5.1)
Sodium: 141 mmol/L (ref 135–145)

## 2015-06-21 MED ORDER — BISACODYL 10 MG RE SUPP
10.0000 mg | Freq: Every day | RECTAL | Status: DC | PRN
  Administered 2015-06-21: 10 mg via RECTAL
  Filled 2015-06-21: qty 1

## 2015-06-21 NOTE — Progress Notes (Signed)
Pharmacy Antibiotic Note  Jared Newton is a 35 y.o. male admitted on 06/17/2015 with new onset hallucinations concerning for encephalitis.  Pharmacy has been consulted for acyclovir dosing. He has been afebrile. Renal function wnl, but no new BMET since 5/13  LP done 5/16, HIV ab neg   Plan: - Acyclovir 700 mg IV q8h  - Monitor C&S, CBC and clinical progression    Height: 5\' 9"  (175.3 cm) Weight: 216 lb 0.8 oz (98 kg) IBW/kg (Calculated) : 70.7  Temp (24hrs), Avg:97.9 F (36.6 C), Min:97.8 F (36.6 C), Max:97.9 F (36.6 C)   Recent Labs Lab 06/17/15 1855 06/17/15 1901  WBC 9.6  --   CREATININE 0.91  --   LATICACIDVEN  --  1.84    Estimated Creatinine Clearance: 133.3 mL/min (by C-G formula based on Cr of 0.91).    No Known Allergies  Antimicrobials this admission: Acyclovir 5/14 >>   Microbiology results: 5/16 CSF - ngtd  5/16 CSF HSV PCR -   Thank you for allowing pharmacy to be a part of this patient's care.  Bayard HuggerMei Antwon Rochin, PharmD, BCPS  Clinical Pharmacist  Pager: [phone]   06/21/2015 11:19 AM
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
## 2015-06-22 LAB — HERPES SIMPLEX VIRUS(HSV) DNA BY PCR
HSV 1 DNA: NEGATIVE
HSV 2 DNA: NEGATIVE

## 2015-06-22 MED ORDER — PAROXETINE HCL ER 12.5 MG PO TB24: 12.5000 mg | ORAL_TABLET | Freq: Every day | ORAL | Status: AC

## 2015-06-22 MED ORDER — RISPERIDONE 0.5 MG PO TABS: 0.5000 mg | ORAL_TABLET | Freq: Every day | ORAL | Status: AC

## 2015-06-22 MED FILL — PAROXETINE CR 12.5 MG TAB: 12.5 | 30 days supply | Qty: 30 | Fill #0

## 2015-06-22 MED FILL — risperiDONE 0.5 MG TABS: 0.5 | 30 days supply | Qty: 30 | Fill #0
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
## 2015-06-22 NOTE — Progress Notes (Signed)
Nsg Discharge Note  Admit Date:  06/17/2015 Discharge date: 06/22/2015   Jared Newton to be D/C'd home per MD order.  AVS completed.  Copy for chart, and copy for patient signed, and dated. Patient able to verbalize understanding.  Discharge Medication:   Medication List    TAKE these medications        PARoxetine 12.5 MG 24 hr tablet  Commonly known as:  PAXIL-CR  Take 1 tablet (12.5 mg total) by mouth at bedtime.     risperiDONE 0.5 MG tablet  Commonly known as:  RISPERDAL  Take 1 tablet (0.5 mg total) by mouth at bedtime.        Discharge Assessment: Filed Vitals:   06/22/15 0604 06/22/15 0703  BP: 155/97 116/67  Pulse: 60 64  Temp: 97.4 F (36.3 C) 98 F (36.7 C)  Resp: 18 18   Skin clean, dry and intact without evidence of skin break down, no evidence of skin tears noted. IV catheter discontinued catheter tip intact. Site without signs and symptoms of complications - no redness or edema noted at insertion site, patient denies c/o pain - only slight tenderness at site.  Dressing with slight pressure applied.  D/c Instructions-Education: Discharge instructions given to patient with verbalized understanding. D/c education completed with patient including follow up instructions, medication list, d/c activities limitations if indicated, with other d/c instructions as indicated by MD - patient able to verbalize understanding, all questions fully answered. Patient instructed to return to ED, call 911, or call MD for any changes in condition.  Patient waiting in room for family member to bring his clothes and pick him up. Pt was told to call RN when he is ready for a wheelchair to take him to the main entrance. Pt in bed, bed low and locked. Call bell within reach. Will continue to monitor pt.   Jared Chadracy Seriah Brotzman, RN 06/22/2015 2:33 PM

## 2015-06-22 NOTE — Progress Notes (Signed)
Pt asked RN to located where this confiscate lighter and two cigarettes. RN looked in the soil utility room where suicide pt belonging is placed. RN did not find it. RN looked in unit cabinets and called security to see if they had it locked up. Security called back and stated that there was not a lighter with cigarettes found and no paperwork for it either. When RN told pt this he stated that's fine, I have more at home. RN ambulated with pt to emergency entrance, his mother was waiting in the car. Pt got into the car safely. Tobin Chadracy Margel Joens 06/22/2015 3:50 PM

## 2015-06-22 NOTE — Progress Notes (Signed)
Interval History:                                                                                                                      Jared Newton is an 35 y.o. male patient with  intermittent visual and auditory hallucinations. 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 No acute pathology noted.  No new neurological symptoms.  Lumbar puncture through IR obtained and shows no abnormality.   EEG normal.     Past Medical History: History reviewed. No pertinent past medical history.  Past Surgical History  Procedure Laterality Date  . Inguinal hernia repair      Family History: History reviewed. No pertinent family history.  Social History:   reports that he has been smoking Cigarettes.  He has been smoking about 0.50 packs per day. He does not have any smokeless tobacco history on file. He reports that he does not drink alcohol or use illicit drugs.  Allergies:  No Known Allergies   Medications:                                                                                                                         Current facility-administered medications:  .  acetaminophen (TYLENOL) tablet 650 mg, 650 mg, Oral, Q6H PRN, Vassie Loll, MD, 650 mg at 06/20/15 2102 .  acyclovir (ZOVIRAX) 700 mg in dextrose 5 % 100 mL IVPB, 700 mg, Intravenous, Q8H, Belinda Fisher Stone, RPH, 700 mg at 06/22/15 1610 .  bisacodyl (DULCOLAX) suppository 10 mg, 10 mg, Rectal, Daily PRN, Meredeth Ide, MD, 10 mg at 06/21/15 0954 .  enoxaparin (LOVENOX) injection 40 mg, 40 mg, Subcutaneous, Q24H, Eston Esters, MD, 40 mg at 06/21/15 2026 .  nicotine (NICODERM CQ - dosed in mg/24 hours) patch 21 mg, 21 mg, Transdermal, Daily, Rhetta Mura, MD, 21 mg at 06/21/15 0954 .  ondansetron (ZOFRAN) injection 4 mg, 4 mg, Intravenous, Q6H PRN, Vassie Loll, MD .  PARoxetine (PAXIL-CR) 24 hr tablet 12.5 mg, 12.5 mg, Oral,  QHS, Leata Mouse, MD, 12.5 mg at 06/21/15 2223 .  risperiDONE (RISPERDAL) tablet 0.5 mg, 0.5 mg, Oral, QHS, Leata Mouse, MD, 0.5 mg at 06/21/15 2223   Neurologic Examination:  Today's Vitals   06/21/15 1325 06/21/15 2058 06/22/15 0604 06/22/15 0703  BP: 130/73 138/75 155/97 116/67  Pulse: 75 80 60 64  Temp: 97.9 F (36.6 C) 97.6 F (36.4 C) 97.4 F (36.3 C) 98 F (36.7 C)  TempSrc: Oral Oral Oral   Resp: 18 18 18 18   Height:      Weight:      SpO2: 97% 96% 97% 96%  PainSc:        Evaluation of higher integrative functions including: Level of alertness: Alert,  Oriented to time, place and person Speech: fluent, no evidence of dysarthria or aphasia noted.  Test the following cranial nerves: 2-12 grossly intact Motor examination: Normal tone, bulk, full 5/5 motor strength in all 4 extremities Examination of sensation : Normal and symmetric sensation to pinprick in all 4 extremities and on face Examination of deep tendon reflexes: 2+, normal and symmetric in all extremities, normal plantars bilaterally Test coordination: Normal finger nose testing, with no evidence of limb appendicular ataxia or abnormal involuntary movements or tremors noted.  Gait: Deferred   Lab Results: Basic Metabolic Panel:  Recent Labs Lab 06/17/15 1855 06/21/15 1549  NA 138 141  K 4.0 3.9  CL 106 105  CO2 21* 26  GLUCOSE 113* 100*  BUN 9 12  CREATININE 0.91 0.82  CALCIUM 9.4 9.3    Liver Function Tests:  Recent Labs Lab 06/18/15 0832  AST 25  ALT 34   No results for input(s): LIPASE, AMYLASE in the last 168 hours. No results for input(s): AMMONIA in the last 168 hours.  CBC:  Recent Labs Lab 06/17/15 1855  WBC 9.6  NEUTROABS 5.9  HGB 17.9*  HCT 50.8  MCV 83.3  PLT 194    Cardiac Enzymes: No results for input(s): CKTOTAL, CKMB, CKMBINDEX, TROPONINI  in the last 168 hours.  Lipid Panel: No results for input(s): CHOL, TRIG, HDL, CHOLHDL, VLDL, LDLCALC in the last 168 hours.  CBG:  Recent Labs Lab 06/17/15 1750  GLUCAP 137*    Microbiology: Results for orders placed or performed during the hospital encounter of 06/17/15  CSF culture     Status: None (Preliminary result)   Collection Time: 06/20/15  9:23 AM  Result Value Ref Range Status   Specimen Description CSF  Final   Special Requests TUBE 2 2.3CC  Final   Gram Stain NO WBC SEEN NO ORGANISMS SEEN CYTOSPIN SMEAR   Final   Culture NO GROWTH 1 DAY  Final   Report Status PENDING  Incomplete    Imaging: Ct Head Wo Contrast  06/21/2015  CLINICAL DATA:  Visual changes.  One pupil is larger than the other. EXAM: CT HEAD WITHOUT CONTRAST TECHNIQUE: Contiguous axial images were obtained from the base of the skull through the vertex without intravenous contrast. COMPARISON:  MRI brain 06/17/2015 FINDINGS: Agenesis or dysgenesis of the corpus callosum with prominent CSF collection in the right anterior parafalcine region. No mass effect or midline shift. Mild dilatation of the posterior horns of the ventricles, likely congenital. Gray-white matter junctions are distinct. Basal cisterns are not effaced. No evidence of acute intracranial hemorrhage. Calvarium appears intact. Retention cyst in the left maxillary antrum. Mastoid air cells are not opacified. IMPRESSION: Agenesis of the corpus callosum with prominent CSF collection in the right anterior parafalcine region. No acute intracranial abnormalities. Electronically Signed   By: Burman NievesWilliam  Stevens M.D.   On: 06/21/2015 05:42   Dg Fluoro Guide Lumbar Puncture  06/20/2015  Haze Boydenaniel W Entrikin,  MD     06/20/2015  9:57 AM Lumbar puncture performed at L4-L5.  Opening pressure 16 cm H2O.  10.5 mL clear CSF obtained and sent to lab.  See dictation in PACS for full details.    Assessment and plan:   Jared Newton is an 35 y.o. male patient with  withIntermittent episodes of auditory and visual hallucinations, with abnormal brain MRI showing severe hypogenesis of corpus callosum, with interhemispheric arachnoid cyst, likely congenital abnormalities.  Based on these abnormality is, there is a theoretical possibility of his intermittent hallucination episodes being epileptiform in nature however--unlikely. EEG obtained and shows no seizure activity.   EEG normal.  No new neurological symptoms.   Neurology will S/O  Felicie Morn PA-C [facility] [phone]  06/22/2015, 9:28 AM

## 2015-06-22 NOTE — Care Management Note (Signed)
Case Management Note  Patient Details  Name: Jared Newton MRN: [id number] Date of Birth: [birth year]  Subjective/Objective:                 Patient admitted for behavioral and neuro eval. Will DC to home. No insurance or PCP. Appointment made at [facility], and entered into AVS. Patient given map and phone number for [facility]. [facility], as he takes his mother there. He understands to fill his Rx there at time of discharge. [facility]. CM explained to patient that they may use the on site pharmacy to fill prescriptions given to them at discharge. Patient aware that the [facility] will not fill narcotics or pain medications prior to the patient being seen by one of their physicians.  Patient aware that they must be seen as a patient prior to the pharmacy filling the prescriptions a second time.    Action/Plan:  Psych CSW [facility].  Expected Discharge Date:                  Expected Discharge Plan:  Home/Self Care (unsure at this time)  In-House Referral:  Clinical Social Work  Discharge planning Services  CM Consult, [facility]  Post Acute Care Choice:  NA Choice offered to:     DME Arranged:    DME Agency:     HH Arranged:    HH Agency:     Status of Service:  Completed, signed off  Medicare Important Message Given:    Date Medicare IM Given:    Medicare IM give by:    Date Additional Medicare IM Given:    Additional Medicare Important Message give by:     If discussed at Long Length of Stay Meetings, dates discussed:    Additional Comments:  Lawerance SabalDebbie Sameer Teeple, RN 06/22/2015, 11:09 AM

## 2015-06-23 LAB — CSF CULTURE W GRAM STAIN: Culture: NO GROWTH

## 2015-06-23 LAB — CSF CULTURE: GRAM STAIN: NONE SEEN

## 2015-07-10 ENCOUNTER — Ambulatory Visit: Payer: Self-pay | Admitting: Family Medicine

## 2015-07-20 LAB — FUNGUS CULTURE WITH STAIN

## 2015-07-20 LAB — FUNGAL ORGANISM REFLEX

## 2015-07-20 LAB — FUNGUS CULTURE RESULT

## 2015-08-14 ENCOUNTER — Ambulatory Visit: Payer: Self-pay | Admitting: Family Medicine

## 2016-02-06 ENCOUNTER — Encounter (HOSPITAL_COMMUNITY): Payer: Self-pay

## 2016-02-06 ENCOUNTER — Emergency Department (HOSPITAL_COMMUNITY)
Admission: EM | Admit: 2016-02-06 | Discharge: 2016-02-06 | Disposition: A | Discharge status: Home / Self Care | Payer: Self-pay | Attending: Emergency Medicine | Admitting: Emergency Medicine

## 2016-02-06 ENCOUNTER — Emergency Department (HOSPITAL_COMMUNITY): Payer: Self-pay

## 2016-02-06 DIAGNOSIS — F17210 Nicotine dependence, cigarettes, uncomplicated: Secondary | ICD-10-CM | POA: Insufficient documentation

## 2016-02-06 DIAGNOSIS — R3129 Other microscopic hematuria: Secondary | ICD-10-CM | POA: Insufficient documentation

## 2016-02-06 DIAGNOSIS — K645 Perianal venous thrombosis: Secondary | ICD-10-CM | POA: Insufficient documentation

## 2016-02-06 DIAGNOSIS — Z79899 Other long term (current) drug therapy: Secondary | ICD-10-CM | POA: Insufficient documentation

## 2016-02-06 LAB — COMPREHENSIVE METABOLIC PANEL
ALK PHOS: 44 U/L (ref 38–126)
ALT: 20 U/L (ref 17–63)
ANION GAP: 7 (ref 5–15)
AST: 23 U/L (ref 15–41)
Albumin: 4.4 g/dL (ref 3.5–5.0)
BILIRUBIN TOTAL: 0.7 mg/dL (ref 0.3–1.2)
BUN: 9 mg/dL (ref 6–20)
CALCIUM: 9.1 mg/dL (ref 8.9–10.3)
CO2: 26 mmol/L (ref 22–32)
Chloride: 106 mmol/L (ref 101–111)
Creatinine, Ser: 0.95 mg/dL (ref 0.61–1.24)
GFR calc Af Amer: >60 mL/min (ref >60)
Glucose, Bld: 87 mg/dL (ref 65–99)
POTASSIUM: 4.1 mmol/L (ref 3.5–5.1)
Sodium: 139 mmol/L (ref 135–145)
TOTAL PROTEIN: 7.2 g/dL (ref 6.5–8.1)

## 2016-02-06 LAB — URINALYSIS, ROUTINE W REFLEX MICROSCOPIC
BILIRUBIN URINE: NEGATIVE
Bacteria, UA: NONE SEEN
GLUCOSE, UA: NEGATIVE mg/dL
Ketones, ur: NEGATIVE mg/dL
LEUKOCYTES UA: NEGATIVE
NITRITE: NEGATIVE
PH: 5 (ref 5.0–8.0)
Protein, ur: NEGATIVE mg/dL
SPECIFIC GRAVITY, URINE: 1.02 (ref 1.005–1.030)
WBC, UA: NONE SEEN WBC/hpf (ref 0–5)

## 2016-02-06 LAB — CBC WITH DIFFERENTIAL/PLATELET
BASOS ABS: 0 10*3/uL (ref 0.0–0.1)
Basophils Relative: 0 %
EOS PCT: 1 %
Eosinophils Absolute: 0.1 10*3/uL (ref 0.0–0.7)
HCT: 52.6 % — ABNORMAL HIGH (ref 39.0–52.0)
Hemoglobin: 18.3 g/dL — ABNORMAL HIGH (ref 13.0–17.0)
LYMPHS PCT: 32 %
Lymphs Abs: 3.8 10*3/uL (ref 0.7–4.0)
MCH: 29.2 pg (ref 26.0–34.0)
MCHC: 34.8 g/dL (ref 30.0–36.0)
MCV: 83.9 fL (ref 78.0–100.0)
Monocytes Absolute: 0.5 10*3/uL (ref 0.1–1.0)
Monocytes Relative: 5 %
NEUTROS ABS: 7.4 10*3/uL (ref 1.7–7.7)
Neutrophils Relative %: 62 %
PLATELETS: 176 10*3/uL (ref 150–400)
RBC: 6.27 MIL/uL — ABNORMAL HIGH (ref 4.22–5.81)
RDW: 13.1 % (ref 11.5–15.5)
WBC: 11.9 10*3/uL — AB (ref 4.0–10.5)

## 2016-02-06 MED ORDER — ONDANSETRON 4 MG PO TBDP
4.0000 mg | ORAL_TABLET | Freq: Once | ORAL | Status: AC
  Administered 2016-02-06: 4 mg via ORAL
  Filled 2016-02-06: qty 1

## 2016-02-06 MED ORDER — PHENYLEPH-SHARK LIV OIL-MO-PET 0.25-3-14-71.9 % RE OINT: 1.0000 "application " | TOPICAL_OINTMENT | Freq: Two times a day (BID) | RECTAL | 0 refills | Status: AC | PRN

## 2016-02-06 MED ORDER — SODIUM CHLORIDE 0.9 % IV BOLUS (SEPSIS)
1000.0000 mL | Freq: Once | INTRAVENOUS | Status: AC
  Administered 2016-02-06: 1000 mL via INTRAVENOUS

## 2016-02-06 MED ORDER — MORPHINE SULFATE (PF) 4 MG/ML IV SOLN
4.0000 mg | Freq: Once | INTRAVENOUS | Status: AC
  Administered 2016-02-06: 4 mg via INTRAVENOUS
  Filled 2016-02-06: qty 1

## 2016-02-06 MED ORDER — POLYETHYLENE GLYCOL 3350 17 G PO PACK: 17.0000 g | PACK | Freq: Every day | ORAL | 0 refills | Status: AC

## 2016-02-06 MED ORDER — DOCUSATE SODIUM 250 MG PO CAPS: 250.0000 mg | ORAL_CAPSULE | Freq: Every day | ORAL | 0 refills | Status: AC

## 2016-02-06 MED ORDER — HYDROCODONE-ACETAMINOPHEN 5-325 MG PO TABS
1.0000 | ORAL_TABLET | Freq: Once | ORAL | Status: AC
  Administered 2016-02-06: 1 via ORAL
  Filled 2016-02-06: qty 1

## 2016-02-06 MED ORDER — ONDANSETRON HCL 4 MG/2ML IJ SOLN
4.0000 mg | Freq: Once | INTRAMUSCULAR | Status: AC
  Administered 2016-02-06: 4 mg via INTRAVENOUS
  Filled 2016-02-06: qty 2

## 2016-02-06 MED ORDER — NAPROXEN 500 MG PO TABS: 500.0000 mg | ORAL_TABLET | Freq: Two times a day (BID) | ORAL | 0 refills | Status: AC

## 2016-02-06 MED ORDER — IOPAMIDOL (ISOVUE-300) INJECTION 61%
INTRAVENOUS | Status: AC
Start: 2016-02-06 — End: 2016-02-06
  Administered 2016-02-06: 75 mL
  Filled 2016-02-06: qty 75

## 2016-02-06 MED ORDER — DIBUCAINE 1 % RE OINT
TOPICAL_OINTMENT | RECTAL | Status: DC | PRN
  Filled 2016-02-06: qty 28
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
## 2016-02-06 NOTE — ED Triage Notes (Signed)
Pt presents for evaluation of lower back pain with L leg tingling x 2 days. Pt denies injury to area. Denies relief with rest. Pt AxO x4, ambulatory in triage.

## 2016-02-06 NOTE — Discharge Instructions (Addendum)
Contact a health care provider if: You have increasing pain and swelling that are not controlled by treatment or medicine. You have uncontrolled bleeding. You have difficulty having a bowel movement, or you are unable to have a bowel movement. You have pain or inflammation outside the area of the hemorrhoids.  Get help right away if: You develop severe vomiting and are unable to keep the medicine down. You develop severe back or abdominal pain despite taking your medicines. You begin passing a large amount of blood or clots in your urine. You feel extremely weak or faint, or you pass out.

## 2016-02-06 NOTE — ED Provider Notes (Signed)
[facility]Y DEPT Provider Note   CSN: [id number] Arrival date & time: 02/06/16  0911     History   Chief Complaint Chief Complaint  Patient presents with  . Back Pain  . Leg Pain    HPI Tra Jared Newton is a 36 y.o. [place] male with a pmh of PTSD, Psychosis, and agenesis of the corpus callosum who presents with cc of rectal pain and back pain. The patient states that he has pain in his rectum. Yesterday when he went to make a bowel movement. He had severe pain while trying to defecate and feels that there is "something in my bottom." He denies any rectal bleeding, fevers or chills. He states that it hurts so much its radiating into his back and left gluteal region. He also has some pain that radiates down the left leg but denies any weakness, saddle anesthesia, bowel or bladder incontinence.  HPI  History reviewed. No pertinent past medical history.  Patient Active Problem List   Diagnosis Date Noted  . Other affective psychosis 06/20/2015  . Hallucination 06/18/2015  . Speech abnormality   . Abnormal finding on MRI of brain   . Corpus callosum agenesis (HCC)   . Hallucinations 06/17/2015    Past Surgical History:  Procedure Laterality Date  . INGUINAL HERNIA REPAIR         Home Medications    Prior to Admission medications   Medication Sig Start Date End Date Taking? Authorizing Provider  PARoxetine (PAXIL-CR) 12.5 MG 24 hr tablet Take 1 tablet (12.5 mg total) by mouth at bedtime. 06/22/15   Meredeth IdeGagan S Lama, MD  risperiDONE (RISPERDAL) 0.5 MG tablet Take 1 tablet (0.5 mg total) by mouth at bedtime. 06/22/15   Meredeth IdeGagan S Lama, MD    Family History No family history on file.  Social History Social History  Substance Use Topics  . Smoking status: Current Every Day Smoker    Packs/day: 0.50    Types: Cigarettes  . Smokeless tobacco: Never Used  . Alcohol use No     Allergies   Patient has no known allergies.   Review of Systems Review of Systems   Ten  systems reviewed and are negative for acute change, except as noted in the HPI.    Physical Exam Updated Vital Signs BP 124/61 (BP Location: Right Arm)   Pulse 65   Temp 97.4 F (36.3 C) (Oral)   Resp 18   Ht 5\' 9"  (1.753 m)   Wt 95.3 kg   SpO2 97%   BMI 31.01 kg/m   Physical Exam  Constitutional: He appears well-developed and well-nourished. No distress.  HENT:  Head: Normocephalic and atraumatic.  Eyes: Conjunctivae are normal. No scleral icterus.  Neck: Normal range of motion. Neck supple.  Cardiovascular: Normal rate, regular rhythm and normal heart sounds.   Pulmonary/Chest: Effort normal and breath sounds normal. No respiratory distress.  Abdominal: Soft. There is no tenderness.  Genitourinary:  Genitourinary Comments: Digital Rectal Exam reveals sphincter with good tone. No external hemorrhoids. No fissures.  Stool color is brown with no overt blood. There is an exquisitely tender, palpable mass present just within the anal sphincter. Patient would not tolerate any further entry into the rectal vault due to severe pain.   Musculoskeletal: He exhibits no edema.  Normal ROM. Negative straight leg. No palpable muscular tenderness or midline tenderness.   Neurological: He is alert.  Skin: Skin is warm and dry. He is not diaphoretic.  Psychiatric: His behavior is normal.  Nursing note and vitals reviewed.    ED Treatments / Results  Labs (all labs ordered are listed, but only abnormal results are displayed) Labs Reviewed - No data to display  EKG  EKG Interpretation None       Radiology No results found.  Procedures Procedures (including critical care time)  Medications Ordered in ED Medications - No data to display   Initial Impression / Assessment and Plan / ED Course  I have reviewed the triage vital signs and the nursing notes.  Pertinent labs & imaging results that were available during my care of the patient were reviewed by me and considered in  my medical decision making (see chart for details).  Clinical Course      Patient CT negative for rectal abscess. Patient seen in the ED by Dr. Almond Lint who states that she feels this is a thrombosed hemorrhoid. Patient has declined excision by Dr. Donell Beers in the ED. Will treat with symptomatic support. Patient will be discharged with symptomatic treatment. He appears appropriate for discharge. Discussed return process.  Final Clinical Impressions(s) / ED Diagnoses   Final diagnoses:  Thrombosed hemorrhoids  Microscopic hematuria    New Prescriptions New Prescriptions   No medications on file     Arthor Captain, PA-C 02/07/16 1603    Jacalyn Lefevre, MD 02/08/16 [phone]

## 2017-01-31 DIAGNOSIS — R6883 Chills (without fever): Secondary | ICD-10-CM | POA: Insufficient documentation

## 2017-01-31 DIAGNOSIS — R05 Cough: Secondary | ICD-10-CM | POA: Insufficient documentation

## 2017-01-31 DIAGNOSIS — H9201 Otalgia, right ear: Secondary | ICD-10-CM | POA: Insufficient documentation

## 2017-01-31 DIAGNOSIS — Z5321 Procedure and treatment not carried out due to patient leaving prior to being seen by health care provider: Secondary | ICD-10-CM | POA: Insufficient documentation

## 2017-02-01 ENCOUNTER — Emergency Department (HOSPITAL_COMMUNITY): Payer: Self-pay

## 2017-02-01 ENCOUNTER — Encounter (HOSPITAL_COMMUNITY): Payer: Self-pay | Admitting: Emergency Medicine

## 2017-02-01 ENCOUNTER — Emergency Department (HOSPITAL_COMMUNITY)
Admission: EM | Admit: 2017-02-01 | Discharge: 2017-02-01 | Disposition: A | Discharge status: Home / Self Care | Payer: Self-pay | Attending: Emergency Medicine | Admitting: Emergency Medicine

## 2017-02-01 LAB — BASIC METABOLIC PANEL
ANION GAP: 8 (ref 5–15)
BUN: 12 mg/dL (ref 6–20)
CALCIUM: 9.3 mg/dL (ref 8.9–10.3)
CO2: 30 mmol/L (ref 22–32)
Chloride: 102 mmol/L (ref 101–111)
Creatinine, Ser: 0.84 mg/dL (ref 0.61–1.24)
GFR calc Af Amer: >60 mL/min (ref >60)
GFR calc non Af Amer: >60 mL/min (ref >60)
GLUCOSE: 87 mg/dL (ref 65–99)
Potassium: 3.7 mmol/L (ref 3.5–5.1)
Sodium: 140 mmol/L (ref 135–145)

## 2017-02-01 LAB — CBC
HCT: 49.2 % (ref 39.0–52.0)
HEMOGLOBIN: 17 g/dL (ref 13.0–17.0)
MCH: 29.5 pg (ref 26.0–34.0)
MCHC: 34.6 g/dL (ref 30.0–36.0)
MCV: 85.4 fL (ref 78.0–100.0)
Platelets: 220 10*3/uL (ref 150–400)
RBC: 5.76 MIL/uL (ref 4.22–5.81)
RDW: 13 % (ref 11.5–15.5)
WBC: 14.1 10*3/uL — ABNORMAL HIGH (ref 4.0–10.5)

## 2017-02-01 LAB — I-STAT TROPONIN, ED: TROPONIN I, POC: 0 ng/mL (ref 0.00–0.08)

## 2017-02-01 NOTE — ED Notes (Signed)
Pt lwbs 

## 2017-02-01 NOTE — ED Triage Notes (Signed)
Reports Upper respiratory congestion with cough, chills, right ear pain.  C/o central chest pain for three days worse with cough.

## 2017-11-09 IMAGING — CT CT HEAD W/O CM
3 of 4 series · 17 of 47 positions shown, 20 images · non-contrast
Comparison: MRI brain 06/17/2015

CLINICAL DATA: Visual changes.  One pupil is larger than the other.

EXAM:
CT HEAD WITHOUT CONTRAST
TECHNIQUE: Contiguous axial images were obtained from the base of the skull
through the vertex without intravenous contrast.

[Series 201: head w/o, idose (1) · axial · non-contrast · 0.49mm/px · z∈[+169,+299]mm · 11 of 32 slices shown, 14 images]
[im 3/32  brain]
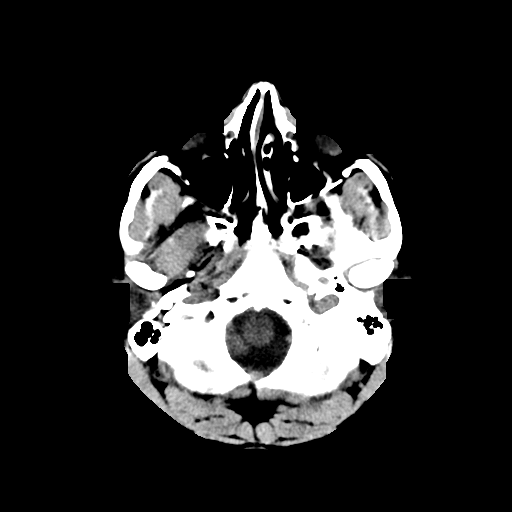
[im 3/32  bone]
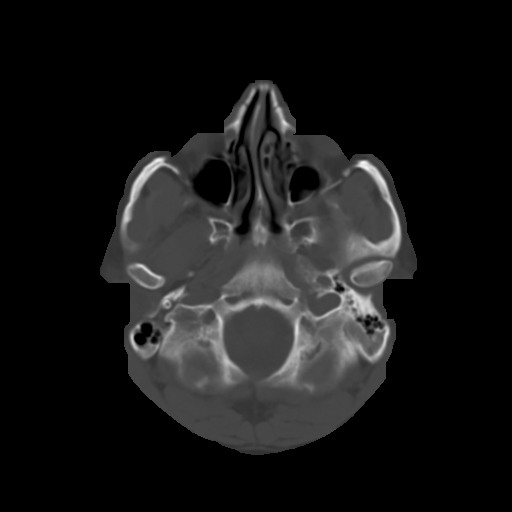
[im 5/32  brain]
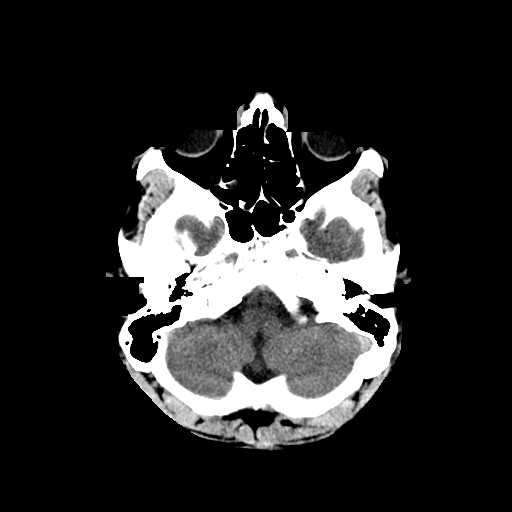
[im 7/32  brain]
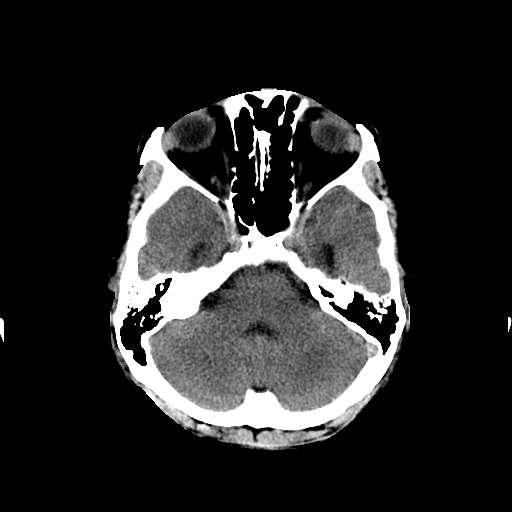
[im 12/32  brain]
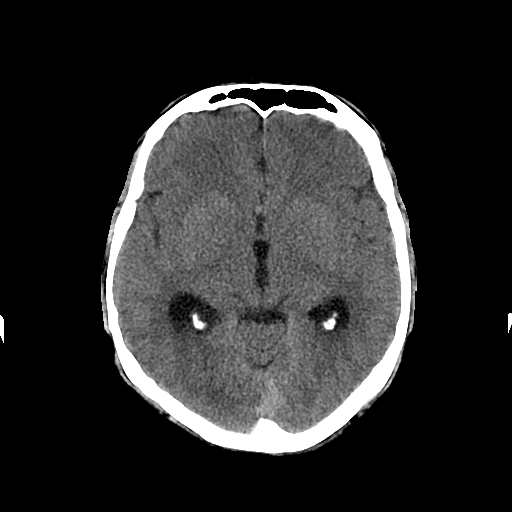
[im 14/32  brain]
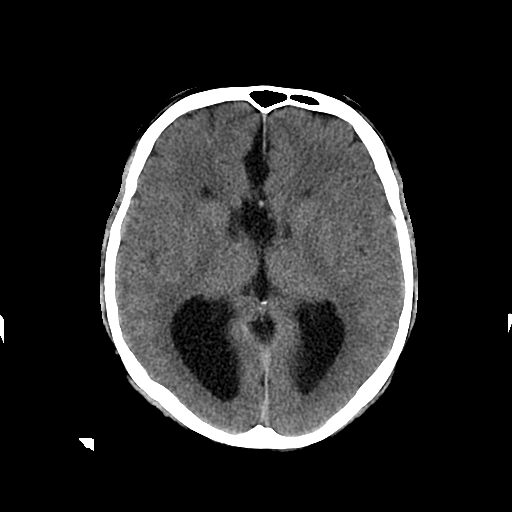
[im 14/32  bone]
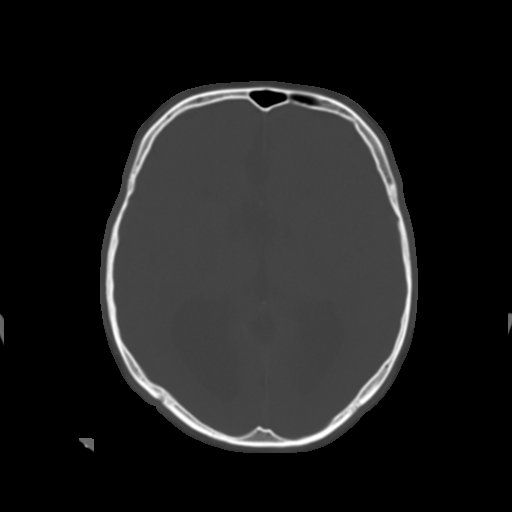
[im 16/32  brain]
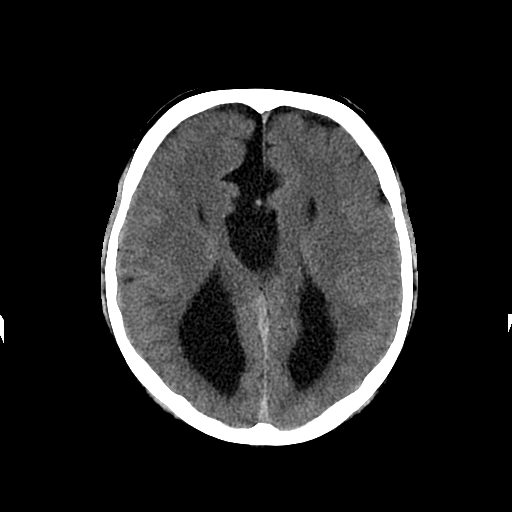
[im 18/32  brain]
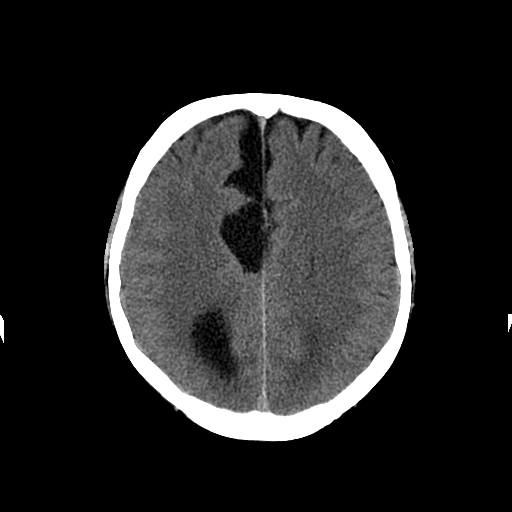
[im 20/32  brain]
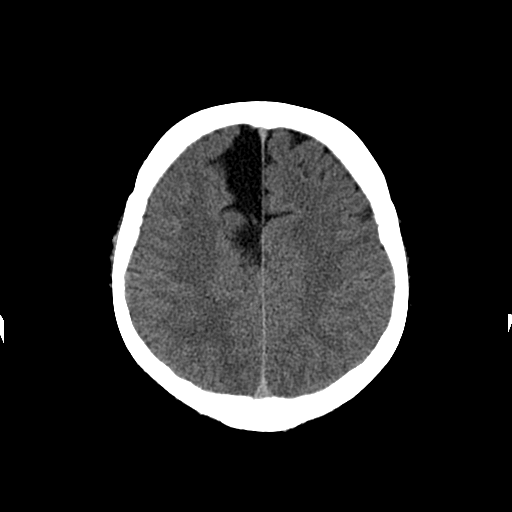
[im 25/32  brain]
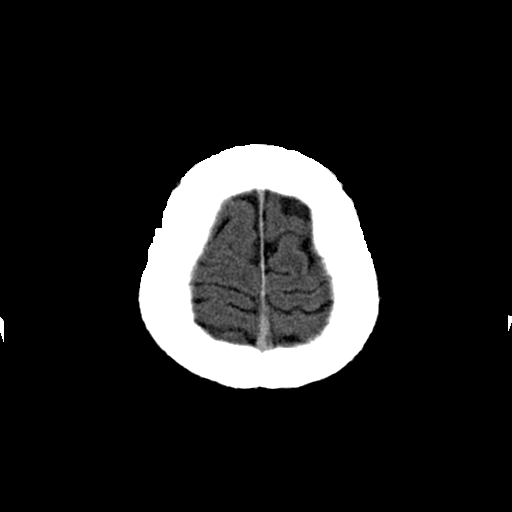
[im 25/32  bone]
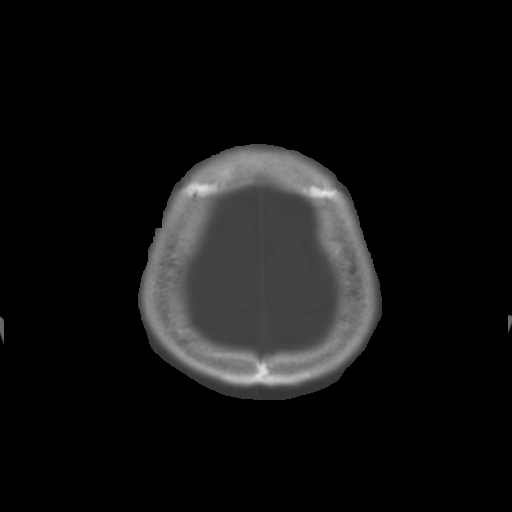
[im 27/32  brain]
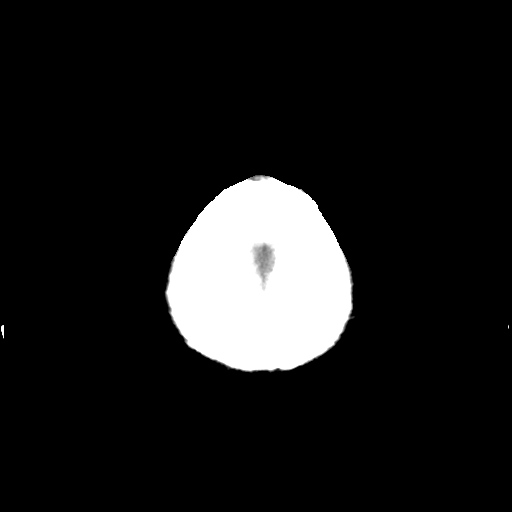
[im 29/32  brain]
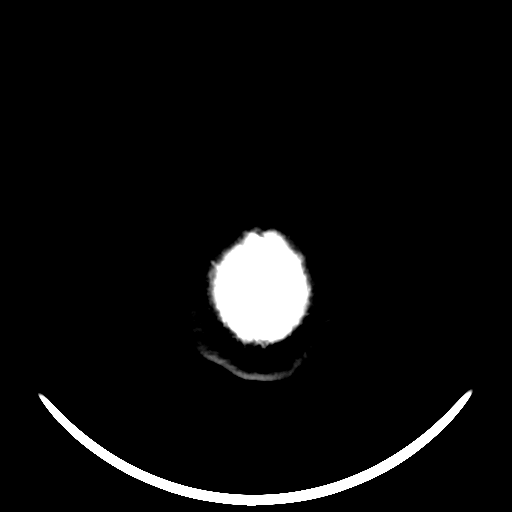

[Series 203: coronal st, idose (1) · coronal · 0.40mm/px · 3 of 73 slices shown]
[im 25/73  brain]
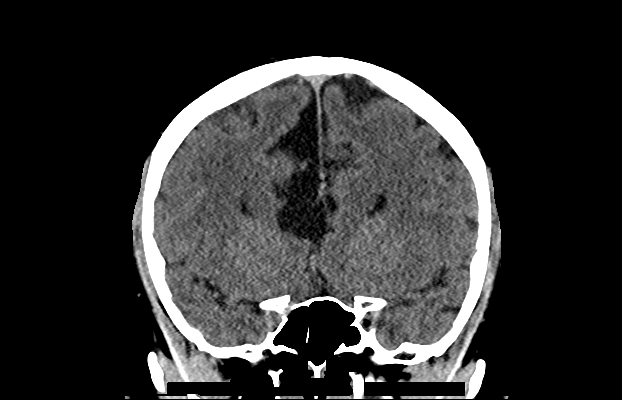
[im 33/73  brain]
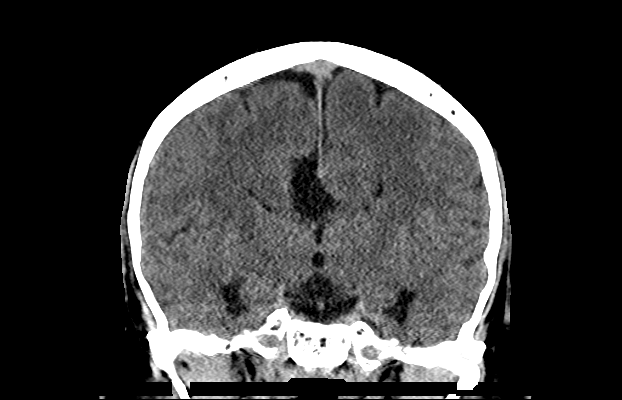
[im 41/73  brain]
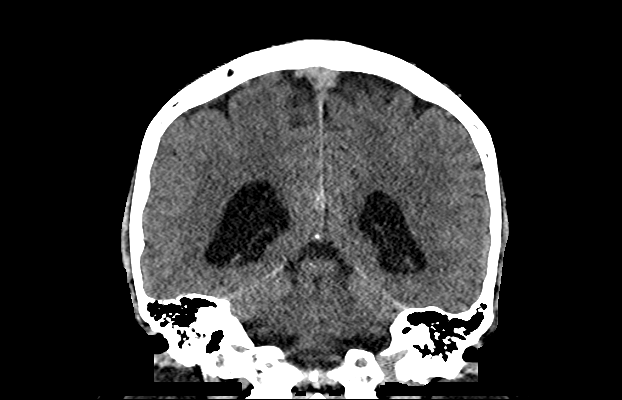

[Series 204: sagittal st, idose (1) · sagittal · 0.40mm/px · 3 of 83 slices shown]
[im 28/83  brain]
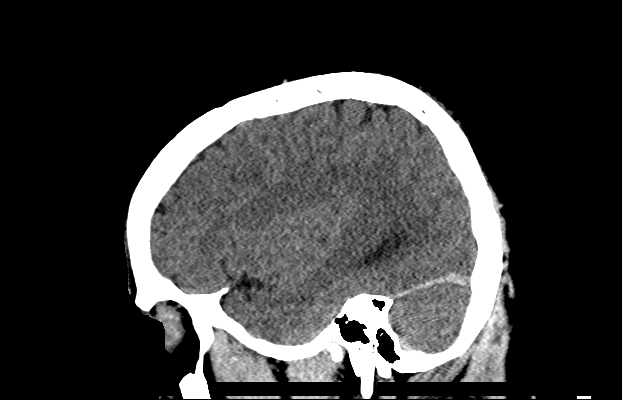
[im 42/83  brain]
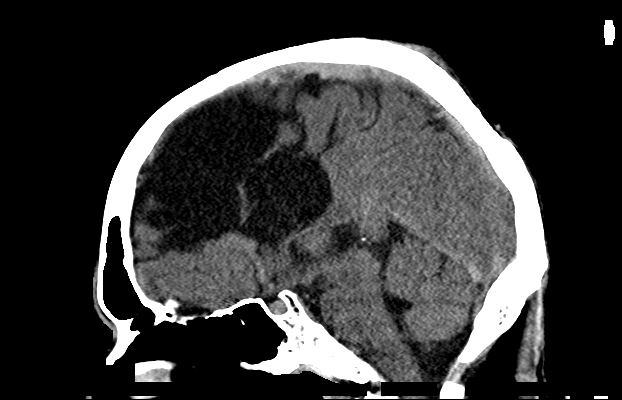
[im 55/83  brain]
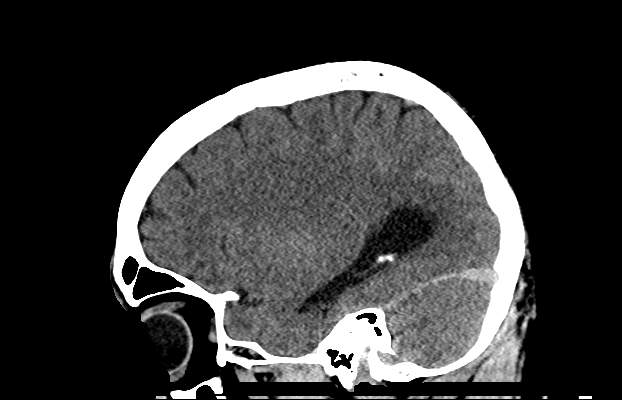

[17 of 47 positions shown; findings below may reference images not displayed]

FINDINGS: Agenesis or dysgenesis of the corpus callosum with prominent CSF
collection in the right anterior parafalcine region. No mass effect
or midline shift. Mild dilatation of the posterior horns of the
ventricles, likely congenital. Gray-white matter junctions are
distinct. Basal cisterns are not effaced. No evidence of acute
intracranial hemorrhage. Calvarium appears intact. Retention cyst in
the left maxillary antrum. Mastoid air cells are not opacified.
IMPRESSION: Agenesis of the corpus callosum with prominent CSF collection in the
right anterior parafalcine region. No acute intracranial
abnormalities.
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
## 2019-06-23 IMAGING — CR DG CHEST 2V
2 series · 2 of 2 positions shown · non-contrast
Comparison: Chest radiograph performed 02/15/2014

CLINICAL DATA: Acute onset of upper chest congestion, cough, chills
and right ear pain. Central chest pain.

EXAM:
CHEST  2 VIEW

[chest pa]
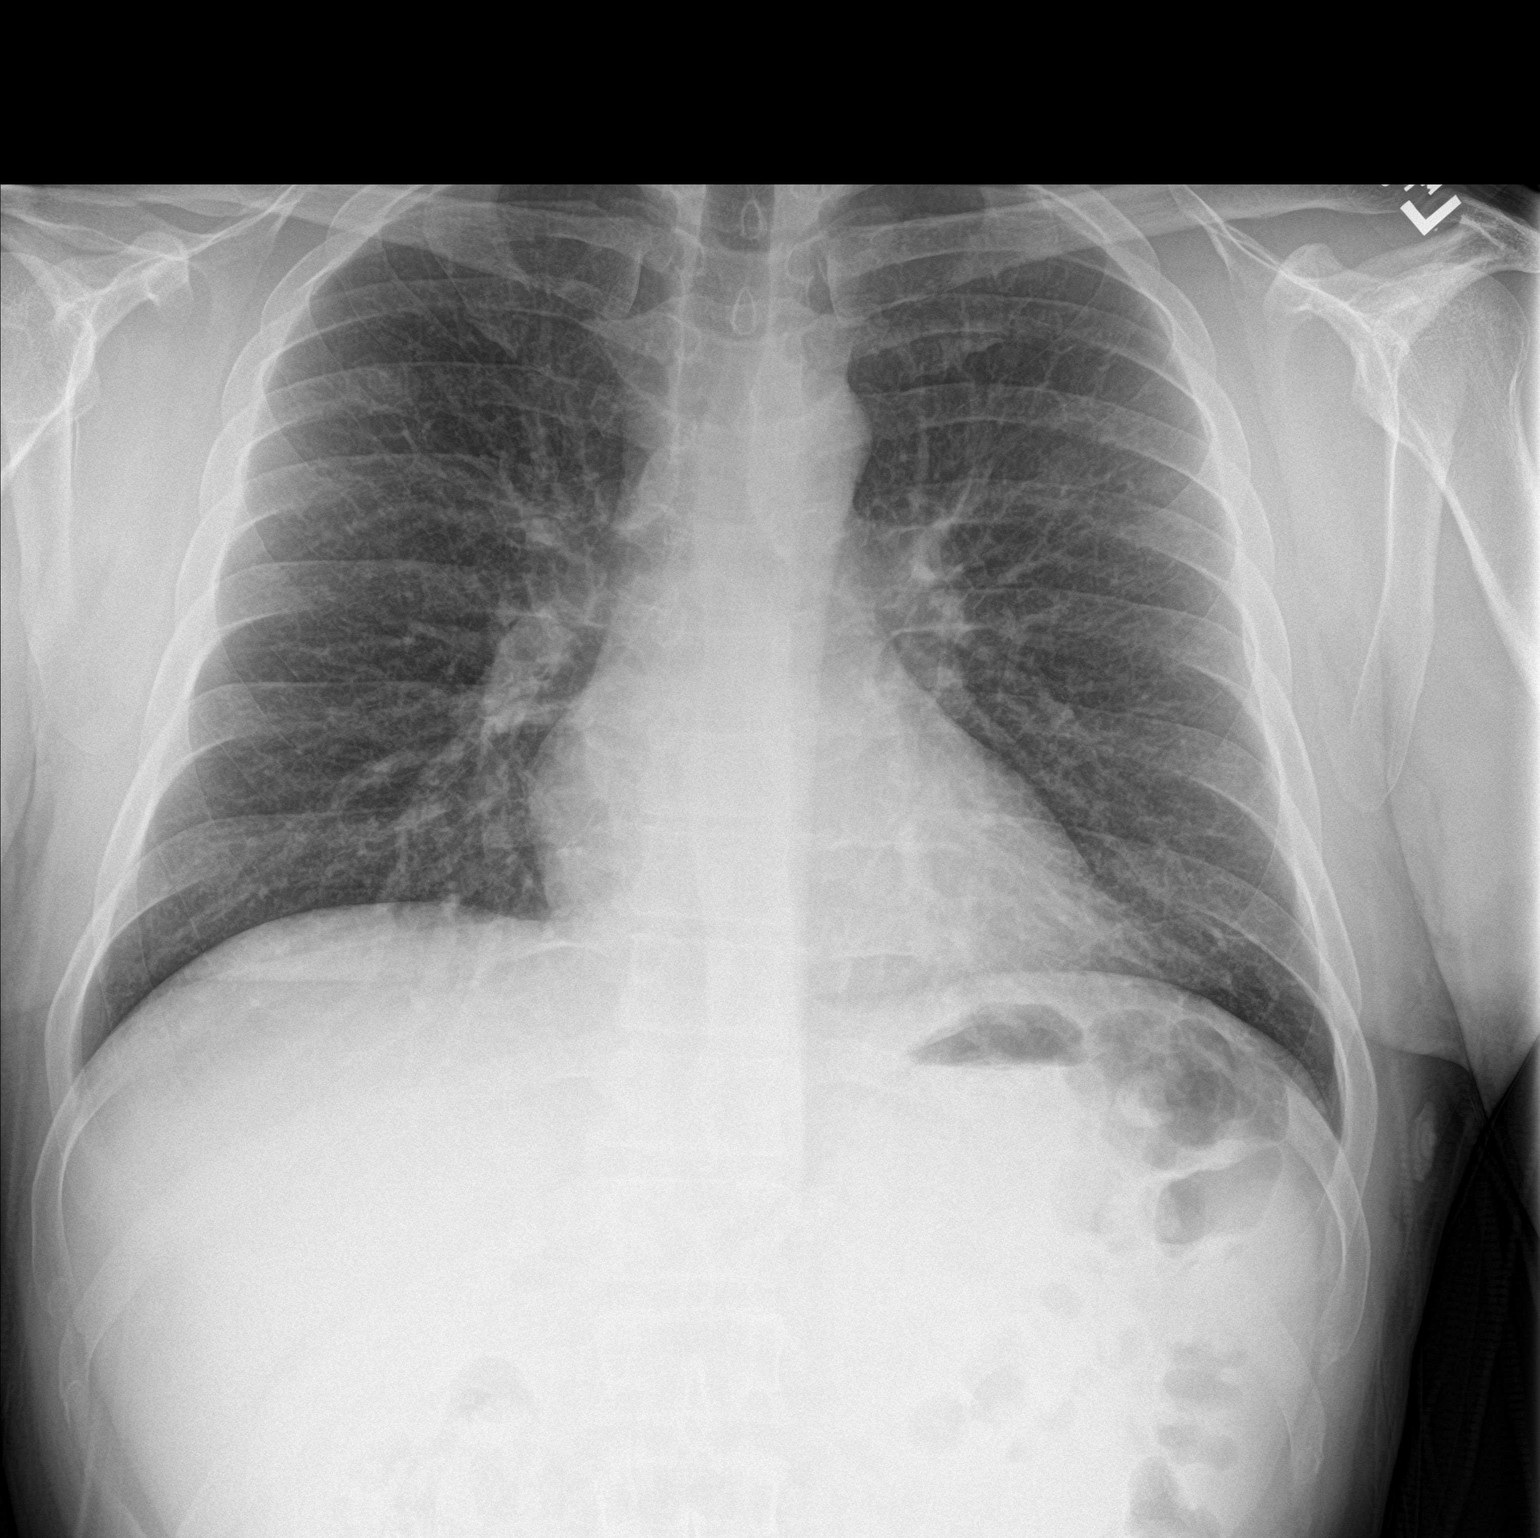

[chest lat]
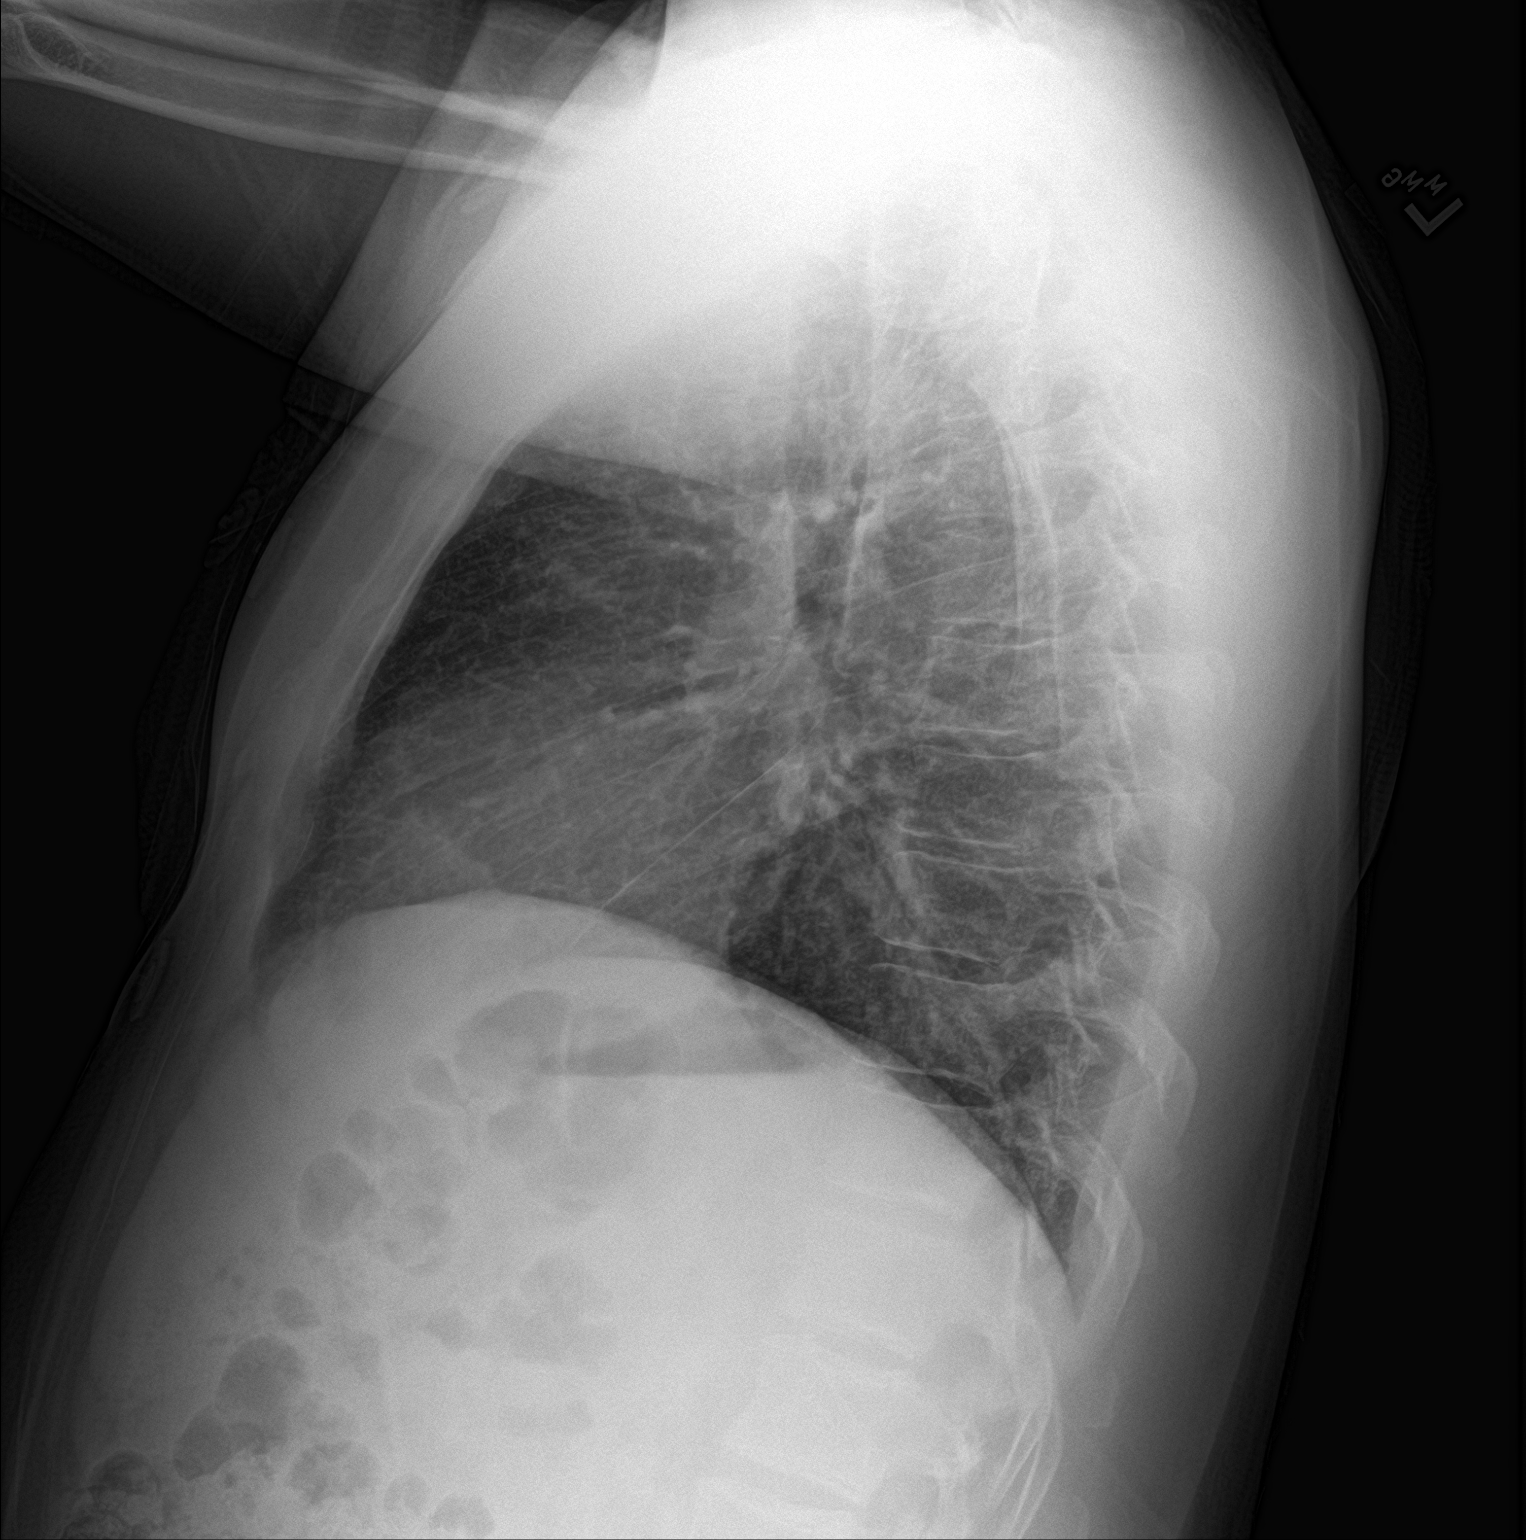

[2 of 2 positions shown; findings below may reference images not displayed]

FINDINGS: The lungs are well-aerated and clear. There is no evidence of focal
opacification, pleural effusion or pneumothorax.

The heart is normal in size; the mediastinal contour is within
normal limits. No acute osseous abnormalities are seen.
IMPRESSION: No acute cardiopulmonary process seen.

## 2021-04-28 DIAGNOSIS — Y998 Other external cause status: Secondary | ICD-10-CM | POA: Diagnosis not present

## 2021-04-28 DIAGNOSIS — W57XXXA Bitten or stung by nonvenomous insect and other nonvenomous arthropods, initial encounter: Secondary | ICD-10-CM | POA: Diagnosis not present

## 2021-04-28 DIAGNOSIS — S70362A Insect bite (nonvenomous), left thigh, initial encounter: Secondary | ICD-10-CM | POA: Diagnosis not present

## 2021-10-29 ENCOUNTER — Other Ambulatory Visit: Payer: Self-pay

## 2021-10-29 ENCOUNTER — Emergency Department (HOSPITAL_COMMUNITY)
Admission: EM | Admit: 2021-10-29 | Discharge: 2021-10-30 | Discharge status: Against Medical Advice | Payer: Self-pay | Attending: Student | Admitting: Student

## 2021-10-29 ENCOUNTER — Encounter (HOSPITAL_COMMUNITY): Payer: Self-pay | Admitting: Emergency Medicine

## 2021-10-29 ENCOUNTER — Emergency Department (HOSPITAL_COMMUNITY): Payer: Self-pay

## 2021-10-29 DIAGNOSIS — F419 Anxiety disorder, unspecified: Secondary | ICD-10-CM | POA: Insufficient documentation

## 2021-10-29 DIAGNOSIS — Z5321 Procedure and treatment not carried out due to patient leaving prior to being seen by health care provider: Secondary | ICD-10-CM | POA: Insufficient documentation

## 2021-10-29 DIAGNOSIS — R0789 Other chest pain: Secondary | ICD-10-CM | POA: Insufficient documentation

## 2021-10-29 NOTE — ED Provider Triage Note (Signed)
Emergency Medicine Provider Triage Evaluation Note  Jared Newton , a 41 y.o. male  was evaluated in triage.  Pt complains of right-sided chest pain that. Short of this evening while he was at work, shortness of breath and anxiety.  States that he drink multiple energy drinks and had to [facility] today did not drink any water.  Also states he was anxious today as he noted his home country of [place] was back at work while watching the news today.  No history of chest pain palpitations or cardiac problems in the past.  Denies any medications he takes daily.  Review of Systems  Positive: As above Negative: As above  Physical Exam  BP 130/69 (BP Location: Right Arm)   Pulse 88   Temp 98.6 F (37 C)   Resp 19   Ht 5\' 9"  (1.753 m)   Wt 88.5 kg   SpO2 97%   BMI 28.80 kg/m  Gen:   Awake, no distress   Resp:  Normal effort  MSK:   Moves extremities without difficulty  Other:  RRR no m/r/g. Lungs CTAB  Medical Decision Making  Medically screening exam initiated at 11:16 PM.  Appropriate orders placed.  Jared Newton was informed that the remainder of the evaluation will be completed by another provider, this initial triage assessment does not replace that evaluation, and the importance of remaining in the ED until their evaluation is complete.  This chart was dictated using voice recognition software, Dragon. Despite the best efforts of this provider to proofread and correct errors, errors may still occur which can change documentation meaning.    Emeline Darling, PA-C 10/29/21 2320
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
## 2021-10-30 ENCOUNTER — Emergency Department (HOSPITAL_BASED_OUTPATIENT_CLINIC_OR_DEPARTMENT_OTHER)
Admission: EM | Admit: 2021-10-30 | Discharge: 2021-10-30 | Disposition: A | Discharge status: Home / Self Care | Payer: Self-pay | Attending: Emergency Medicine | Admitting: Emergency Medicine

## 2021-10-30 ENCOUNTER — Encounter (HOSPITAL_BASED_OUTPATIENT_CLINIC_OR_DEPARTMENT_OTHER): Payer: Self-pay | Admitting: Emergency Medicine

## 2021-10-30 DIAGNOSIS — R200 Anesthesia of skin: Secondary | ICD-10-CM | POA: Insufficient documentation

## 2021-10-30 DIAGNOSIS — R079 Chest pain, unspecified: Secondary | ICD-10-CM

## 2021-10-30 DIAGNOSIS — R0789 Other chest pain: Secondary | ICD-10-CM | POA: Insufficient documentation

## 2021-10-30 LAB — CBC WITH DIFFERENTIAL/PLATELET
Abs Immature Granulocytes: 0.04 10*3/uL (ref 0.00–0.07)
Basophils Absolute: 0.1 10*3/uL (ref 0.0–0.1)
Basophils Relative: 0 %
Eosinophils Absolute: 0.1 10*3/uL (ref 0.0–0.5)
Eosinophils Relative: 1 %
HCT: 51.3 % (ref 39.0–52.0)
Hemoglobin: 18 g/dL — ABNORMAL HIGH (ref 13.0–17.0)
Immature Granulocytes: 0 %
Lymphocytes Relative: 23 %
Lymphs Abs: 3.1 10*3/uL (ref 0.7–4.0)
MCH: 29.3 pg (ref 26.0–34.0)
MCHC: 35.1 g/dL (ref 30.0–36.0)
MCV: 83.4 fL (ref 80.0–100.0)
Monocytes Absolute: 0.8 10*3/uL (ref 0.1–1.0)
Monocytes Relative: 6 %
Neutro Abs: 9.2 10*3/uL — ABNORMAL HIGH (ref 1.7–7.7)
Neutrophils Relative %: 70 %
Platelets: 259 10*3/uL (ref 150–400)
RBC: 6.15 MIL/uL — ABNORMAL HIGH (ref 4.22–5.81)
RDW: 12.9 % (ref 11.5–15.5)
WBC: 13.3 10*3/uL — ABNORMAL HIGH (ref 4.0–10.5)
nRBC: 0 % (ref 0.0–0.2)

## 2021-10-30 LAB — COMPREHENSIVE METABOLIC PANEL
ALT: 47 U/L — ABNORMAL HIGH (ref 0–44)
AST: 35 U/L (ref 15–41)
Albumin: 4.3 g/dL (ref 3.5–5.0)
Alkaline Phosphatase: 64 U/L (ref 38–126)
Anion gap: 10 (ref 5–15)
BUN: 10 mg/dL (ref 6–20)
CO2: 23 mmol/L (ref 22–32)
Calcium: 9.5 mg/dL (ref 8.9–10.3)
Chloride: 109 mmol/L (ref 98–111)
Creatinine, Ser: 1.08 mg/dL (ref 0.61–1.24)
GFR, Estimated: >60 mL/min (ref >60)
Glucose, Bld: 91 mg/dL (ref 70–99)
Potassium: 3.9 mmol/L (ref 3.5–5.1)
Sodium: 142 mmol/L (ref 135–145)
Total Bilirubin: 0.5 mg/dL (ref 0.3–1.2)
Total Protein: 7.2 g/dL (ref 6.5–8.1)

## 2021-10-30 LAB — TROPONIN I (HIGH SENSITIVITY)
Troponin I (High Sensitivity): 3 ng/L (ref <18)
Troponin I (High Sensitivity): 2 ng/L (ref <18)

## 2021-10-30 LAB — D-DIMER, QUANTITATIVE: D-Dimer, Quant: 0.33 ug/mL-FEU (ref 0.00–0.50)

## 2021-10-30 MED ORDER — METHOCARBAMOL 500 MG PO TABS: 500.0000 mg | ORAL_TABLET | Freq: Three times a day (TID) | ORAL | 0 refills | Status: AC | PRN

## 2021-10-30 NOTE — ED Triage Notes (Signed)
Pt states was at work for 4 hours started having chest pain the worsens with movement. States also has SOB and right arm/hand Numbness. EMS was called and transported to [facility]. Left [facility] due to long wait.

## 2021-10-30 NOTE — ED Provider Notes (Signed)
[facility] EMERGENCY DEPARTMENT Provider Note   CSN: [id number] Arrival date & time: 10/30/21  0319     History  Chief Complaint  Patient presents with   Chest Pain    Jared Newton is a 41 y.o. male.   Chest Pain Patient with chest pain.  Anterior chest.  Began while at work last night.  Right mid chest.  Worse with breathing.  States was short of breath and right hand felt a little numb.  Went to [facility] but there was too long of a wait.  Has not had episodes like before.  Still some pain there is.  Still feels mildly shortness of breath.  No known heart history.  Patient does smoke.  Patient's father reportedly had a stroke at a young age.  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    History reviewed. No pertinent past medical history.   Home Medications Prior to Admission medications   Medication Sig Start Date End Date Taking? Authorizing Provider  methocarbamol (ROBAXIN) 500 MG tablet Take 1 tablet (500 mg total) by mouth every 8 (eight) hours as needed for muscle spasms. 10/30/21  Yes Benjiman Core, MD  docusate sodium (COLACE) 250 MG capsule Take 1 capsule (250 mg total) by mouth daily. 02/06/16   Arthor Captain, PA-C  ibuprofen (ADVIL,MOTRIN) 200 MG tablet Take 200 mg by mouth every 6 (six) hours as needed for headache.    [provider]  naproxen (NAPROSYN) 500 MG tablet Take 1 tablet (500 mg total) by mouth 2 (two) times daily with a meal. 02/06/16   Harris, Abigail, PA-C  PARoxetine (PAXIL-CR) 12.5 MG 24 hr tablet Take 1 tablet (12.5 mg total) by mouth at bedtime. Patient not taking: Reported on 02/06/2016 06/22/15   Meredeth Ide, MD  phenylephrine-shark liver oil-mineral oil-petrolatum (PREPARATION H) 0.25-3-14-71.9 % rectal ointment Place 1 application rectally 2 (two) times daily as needed for hemorrhoids. 02/06/16   Harris, Abigail, PA-C  polyethylene glycol (MIRALAX / GLYCOLAX) packet Take 17 g by mouth daily.  02/06/16   Arthor Captain, PA-C  risperiDONE (RISPERDAL) 0.5 MG tablet Take 1 tablet (0.5 mg total) by mouth at bedtime. Patient not taking: Reported on 02/06/2016 06/22/15   Meredeth Ide, MD      Allergies    Patient has no known allergies.    Review of Systems   Review of Systems  Cardiovascular:  Positive for chest pain.    Physical Exam Updated Vital Signs BP 125/89   Pulse 60   Temp 97.8 F (36.6 C)   Resp 18   Ht 5\' 9"  (1.753 m)   Wt 88.5 kg   SpO2 91%   BMI 28.80 kg/m  Physical Exam Vitals and nursing note reviewed.  Cardiovascular:     Rate and Rhythm: Normal rate and regular rhythm.  Pulmonary:     Breath sounds: No wheezing, rhonchi or rales.  Chest:     Chest wall: Tenderness present.     Comments:  tenderness to right anterior chest wall.  No rash.  No deformity.  No crepitance. Abdominal:     Tenderness: There is no abdominal tenderness.  Musculoskeletal:     Right lower leg: No edema.     Left lower leg: No edema.  Neurological:     Mental Status: He is alert.     ED Results / Procedures / Treatments   Labs (all labs ordered are listed, but only abnormal  results are displayed) Labs Reviewed  D-DIMER, QUANTITATIVE  TROPONIN I (HIGH SENSITIVITY)    EKG None  Radiology DG Chest 2 View  Result Date: 10/29/2021 CLINICAL DATA:  Chest pain. EXAM: CHEST - 2 VIEW COMPARISON:  02/01/2017. FINDINGS: The heart size and mediastinal contours are within normal limits. Mild interstitial prominence is noted bilaterally. No consolidation, effusion, or pneumothorax. No acute osseous abnormality. IMPRESSION: No active cardiopulmonary disease. Electronically Signed   By: Brett Fairy M.D.   On: 10/29/2021 23:34    Procedures Procedures    Medications Ordered in ED Medications - No data to display  ED Course/ Medical Decision Making/ A&P                           Medical Decision Making Amount and/or Complexity of Data Reviewed Labs:  ordered.  Risk Prescription drug management.   Chest pain.  Anterior chest.  Began acutely.  EKG reassuring.  Chest x-ray reassuring.  Troponin negative x3.  Doubt cardiac ischemia as the cause.  Also pneumonia pneumothorax felt less likely.  However with somewhat pleuritic pain will get D-dimer.  High risk due to smoking.  Also does an elevated hemoglobin which appears to be his baseline.  D-dimer is negative.  Reviewed lab work and x-ray from [facility].  Doubt pulmonary embolism.  Chest x-ray reassuring.  Lungs clear.  No coughing.  Pneumonia felt less likely.  Will discharge home with outpatient follow-up as needed with symptomatic treatment.        Final Clinical Impression(s) / ED Diagnoses Final diagnoses:  Nonspecific chest pain    Rx / DC Orders ED Discharge Orders          Ordered    methocarbamol (ROBAXIN) 500 MG tablet  Every 8 hours PRN        10/30/21 0814              Davonna Belling, MD 10/30/21 [phone]

## 2021-10-30 NOTE — ED Notes (Signed)
Pt IV took out. Pt left AMA
